# Patient Record
Sex: Female | Born: 1960 | Hispanic: No | Marital: Married | Smoking: Current every day smoker
Health system: Southern US, Community
[De-identification: ages and names within clinical notes are randomized; demographics above are authoritative.]

## PROBLEM LIST (undated history)

## (undated) DIAGNOSIS — N946 Dysmenorrhea, unspecified: Secondary | ICD-10-CM

## (undated) DIAGNOSIS — M8580 Other specified disorders of bone density and structure, unspecified site: Secondary | ICD-10-CM

## (undated) DIAGNOSIS — B9689 Other specified bacterial agents as the cause of diseases classified elsewhere: Secondary | ICD-10-CM

## (undated) DIAGNOSIS — N920 Excessive and frequent menstruation with regular cycle: Secondary | ICD-10-CM

## (undated) DIAGNOSIS — Z8041 Family history of malignant neoplasm of ovary: Secondary | ICD-10-CM

## (undated) DIAGNOSIS — N83209 Unspecified ovarian cyst, unspecified side: Secondary | ICD-10-CM

## (undated) DIAGNOSIS — Z803 Family history of malignant neoplasm of breast: Secondary | ICD-10-CM

## (undated) DIAGNOSIS — R87619 Unspecified abnormal cytological findings in specimens from cervix uteri: Secondary | ICD-10-CM

## (undated) DIAGNOSIS — D649 Anemia, unspecified: Secondary | ICD-10-CM

## (undated) DIAGNOSIS — N760 Acute vaginitis: Secondary | ICD-10-CM

## (undated) DIAGNOSIS — N809 Endometriosis, unspecified: Secondary | ICD-10-CM

## (undated) DIAGNOSIS — K219 Gastro-esophageal reflux disease without esophagitis: Secondary | ICD-10-CM

## (undated) DIAGNOSIS — J449 Chronic obstructive pulmonary disease, unspecified: Secondary | ICD-10-CM

## (undated) DIAGNOSIS — E7800 Pure hypercholesterolemia, unspecified: Secondary | ICD-10-CM

## (undated) DIAGNOSIS — Z1371 Encounter for nonprocreative screening for genetic disease carrier status: Secondary | ICD-10-CM

## (undated) HISTORY — DX: Anemia, unspecified: D64.9

## (undated) HISTORY — DX: Family history of malignant neoplasm of ovary: Z80.41

## (undated) HISTORY — DX: Other specified disorders of bone density and structure, unspecified site: M85.80

## (undated) HISTORY — DX: Family history of malignant neoplasm of breast: Z80.3

## (undated) HISTORY — DX: Pure hypercholesterolemia, unspecified: E78.00

## (undated) HISTORY — PX: BACK SURGERY: SHX140

## (undated) HISTORY — PX: DIAGNOSTIC LAPAROSCOPY: SUR761

## (undated) HISTORY — DX: Unspecified ovarian cyst, unspecified side: N83.209

## (undated) HISTORY — DX: Dysmenorrhea, unspecified: N94.6

## (undated) HISTORY — DX: Other specified bacterial agents as the cause of diseases classified elsewhere: N76.0

## (undated) HISTORY — DX: Unspecified abnormal cytological findings in specimens from cervix uteri: R87.619

## (undated) HISTORY — DX: Excessive and frequent menstruation with regular cycle: N92.0

## (undated) HISTORY — DX: Other specified bacterial agents as the cause of diseases classified elsewhere: B96.89

## (undated) HISTORY — DX: Endometriosis, unspecified: N80.9

## (undated) HISTORY — PX: LAPAROSCOPIC ENDOMETRIOSIS FULGURATION: SUR769

## (undated) HISTORY — DX: Encounter for nonprocreative screening for genetic disease carrier status: Z13.71

---

## 2002-01-26 ENCOUNTER — Ambulatory Visit (HOSPITAL_COMMUNITY): Admission: RE | Admit: 2002-01-26 | Discharge: 2002-01-27 | Payer: Self-pay | Admitting: Neurosurgery

## 2002-01-26 ENCOUNTER — Encounter: Payer: Self-pay | Admitting: Neurosurgery

## 2005-01-07 ENCOUNTER — Ambulatory Visit: Payer: Self-pay

## 2007-03-19 ENCOUNTER — Observation Stay: Payer: Self-pay | Admitting: Internal Medicine

## 2007-03-19 ENCOUNTER — Other Ambulatory Visit: Payer: Self-pay

## 2007-03-19 ENCOUNTER — Ambulatory Visit: Payer: Self-pay | Admitting: Internal Medicine

## 2007-06-29 ENCOUNTER — Ambulatory Visit: Payer: Self-pay

## 2008-11-27 ENCOUNTER — Ambulatory Visit: Payer: Self-pay | Admitting: Internal Medicine

## 2009-06-15 ENCOUNTER — Ambulatory Visit: Payer: Self-pay

## 2010-06-12 ENCOUNTER — Ambulatory Visit: Payer: Self-pay

## 2011-06-13 ENCOUNTER — Ambulatory Visit: Payer: Self-pay

## 2012-01-20 ENCOUNTER — Ambulatory Visit: Payer: Self-pay

## 2012-01-20 LAB — RAPID STREP-A WITH REFLX: Micro Text Report: POSITIVE

## 2012-07-06 ENCOUNTER — Ambulatory Visit: Payer: Self-pay

## 2012-07-14 ENCOUNTER — Ambulatory Visit: Payer: Self-pay

## 2013-01-12 ENCOUNTER — Ambulatory Visit: Payer: Self-pay

## 2013-07-30 DIAGNOSIS — Z1371 Encounter for nonprocreative screening for genetic disease carrier status: Secondary | ICD-10-CM

## 2013-07-30 DIAGNOSIS — Z803 Family history of malignant neoplasm of breast: Secondary | ICD-10-CM

## 2013-07-30 HISTORY — DX: Family history of malignant neoplasm of breast: Z80.3

## 2013-07-30 HISTORY — DX: Encounter for nonprocreative screening for genetic disease carrier status: Z13.71

## 2013-08-04 ENCOUNTER — Ambulatory Visit: Payer: Self-pay

## 2014-09-01 ENCOUNTER — Other Ambulatory Visit: Payer: Self-pay | Admitting: Obstetrics and Gynecology

## 2014-09-01 DIAGNOSIS — Z1231 Encounter for screening mammogram for malignant neoplasm of breast: Secondary | ICD-10-CM

## 2014-09-06 ENCOUNTER — Ambulatory Visit
Admission: RE | Admit: 2014-09-06 | Discharge: 2014-09-06 | Disposition: A | Discharge status: Home / Self Care | Payer: 59 | Source: Ambulatory Visit | Attending: Obstetrics and Gynecology | Admitting: Obstetrics and Gynecology

## 2014-09-06 DIAGNOSIS — Z1231 Encounter for screening mammogram for malignant neoplasm of breast: Secondary | ICD-10-CM | POA: Diagnosis not present

## 2014-10-17 NOTE — Discharge Instructions (Signed)

## 2014-10-19 ENCOUNTER — Ambulatory Visit
Admission: RE | Admit: 2014-10-19 | Discharge: 2014-10-19 | Disposition: A | Discharge status: Home / Self Care | Payer: 59 | Source: Ambulatory Visit | Attending: Gastroenterology | Admitting: Gastroenterology

## 2014-10-19 ENCOUNTER — Encounter
Admission: RE | Disposition: A | Discharge status: Home / Self Care | Payer: Self-pay | Source: Ambulatory Visit | Attending: Gastroenterology

## 2014-10-19 ENCOUNTER — Ambulatory Visit: Payer: 59 | Admitting: Anesthesiology

## 2014-10-19 ENCOUNTER — Encounter: Payer: Self-pay | Admitting: *Deleted

## 2014-10-19 DIAGNOSIS — Z807 Family history of other malignant neoplasms of lymphoid, hematopoietic and related tissues: Secondary | ICD-10-CM | POA: Diagnosis not present

## 2014-10-19 DIAGNOSIS — Z803 Family history of malignant neoplasm of breast: Secondary | ICD-10-CM | POA: Diagnosis not present

## 2014-10-19 DIAGNOSIS — Z801 Family history of malignant neoplasm of trachea, bronchus and lung: Secondary | ICD-10-CM | POA: Diagnosis not present

## 2014-10-19 DIAGNOSIS — K219 Gastro-esophageal reflux disease without esophagitis: Secondary | ICD-10-CM | POA: Diagnosis not present

## 2014-10-19 DIAGNOSIS — Z79899 Other long term (current) drug therapy: Secondary | ICD-10-CM | POA: Insufficient documentation

## 2014-10-19 DIAGNOSIS — Z1211 Encounter for screening for malignant neoplasm of colon: Secondary | ICD-10-CM | POA: Insufficient documentation

## 2014-10-19 DIAGNOSIS — J449 Chronic obstructive pulmonary disease, unspecified: Secondary | ICD-10-CM | POA: Insufficient documentation

## 2014-10-19 DIAGNOSIS — F17210 Nicotine dependence, cigarettes, uncomplicated: Secondary | ICD-10-CM | POA: Insufficient documentation

## 2014-10-19 HISTORY — DX: Gastro-esophageal reflux disease without esophagitis: K21.9

## 2014-10-19 HISTORY — PX: COLONOSCOPY: SHX5424

## 2014-10-19 HISTORY — DX: Chronic obstructive pulmonary disease, unspecified: J44.9

## 2014-10-19 SURGERY — COLONOSCOPY
Anesthesia: Monitor Anesthesia Care | Wound class: Contaminated

## 2014-10-19 MED ORDER — OXYCODONE HCL 5 MG PO TABS: 5.0000 mg | ORAL_TABLET | Freq: Once | ORAL | Status: DC | PRN

## 2014-10-19 MED ORDER — ACETAMINOPHEN 160 MG/5ML PO SOLN: 325.0000 mg | ORAL | Status: DC | PRN

## 2014-10-19 MED ORDER — ACETAMINOPHEN 325 MG PO TABS: 325.0000 mg | ORAL_TABLET | ORAL | Status: DC | PRN

## 2014-10-19 MED ORDER — SIMETHICONE 40 MG/0.6ML PO SUSP
ORAL | Status: DC | PRN
  Administered 2014-10-19: 10:00:00

## 2014-10-19 MED ORDER — DEXAMETHASONE SODIUM PHOSPHATE 4 MG/ML IJ SOLN: 8.0000 mg | Freq: Once | INTRAMUSCULAR | Status: DC | PRN

## 2014-10-19 MED ORDER — LIDOCAINE HCL (CARDIAC) 20 MG/ML IV SOLN
INTRAVENOUS | Status: DC | PRN
  Administered 2014-10-19: 50 mg via INTRAVENOUS

## 2014-10-19 MED ORDER — LACTATED RINGERS IV SOLN
INTRAVENOUS | Status: DC
  Administered 2014-10-19: 09:00:00 via INTRAVENOUS

## 2014-10-19 MED ORDER — LACTATED RINGERS IV SOLN
500.0000 mL | INTRAVENOUS | Status: DC
  Administered 2014-10-19: 10:00:00 via INTRAVENOUS

## 2014-10-19 MED ORDER — PROPOFOL 10 MG/ML IV BOLUS
INTRAVENOUS | Status: DC | PRN
  Administered 2014-10-19: 30 mg via INTRAVENOUS
  Administered 2014-10-19: 10 mg via INTRAVENOUS
  Administered 2014-10-19: 30 mg via INTRAVENOUS
  Administered 2014-10-19: 20 mg via INTRAVENOUS
  Administered 2014-10-19: 60 mg via INTRAVENOUS
  Administered 2014-10-19 (×2): 30 mg via INTRAVENOUS
  Administered 2014-10-19: 20 mg via INTRAVENOUS

## 2014-10-19 MED ORDER — FENTANYL CITRATE (PF) 100 MCG/2ML IJ SOLN: 25.0000 ug | INTRAMUSCULAR | Status: DC | PRN

## 2014-10-19 MED ORDER — SODIUM CHLORIDE 0.9 % IV SOLN: INTRAVENOUS | Status: DC

## 2014-10-19 MED ORDER — OXYCODONE HCL 5 MG/5ML PO SOLN: 5.0000 mg | Freq: Once | ORAL | Status: DC | PRN

## 2014-10-19 SURGICAL SUPPLY — 30 items

## 2014-10-19 NOTE — Anesthesia Postprocedure Evaluation (Signed)
  Anesthesia Post-op Note  Patient: Eileen Cunningham  Procedure(s) Performed: Procedure(s): COLONOSCOPY (N/A)  Anesthesia type:MAC  Patient location: PACU  Post pain: Pain level controlled  Post assessment: Post-op Vital signs reviewed, Patient's Cardiovascular Status Stable, Respiratory Function Stable, Patent Airway and No signs of Nausea or vomiting  Post vital signs: Reviewed and stable  Last Vitals:  Filed Vitals:   10/19/14 1015  BP: 108/71  Pulse: 66  Temp:   Resp: 27    Level of consciousness: awake, alert  and patient cooperative  Complications: No apparent anesthesia complications

## 2014-10-19 NOTE — Anesthesia Procedure Notes (Signed)
Procedure Name: MAC Performed by: Enos Muhl Pre-anesthesia Checklist: Patient identified, Emergency Drugs available, Suction available, Timeout performed and Patient being monitored Patient Re-evaluated:Patient Re-evaluated prior to inductionOxygen Delivery Method: Nasal cannula Placement Confirmation: positive ETCO2       

## 2014-10-19 NOTE — Transfer of Care (Signed)
Immediate Anesthesia Transfer of Care Note  Patient: Eileen Cunningham  Procedure(s) Performed: Procedure(s): COLONOSCOPY (N/A)  Patient Location: PACU  Anesthesia Type: MAC  Level of Consciousness: awake, alert  and patient cooperative  Airway and Oxygen Therapy: Patient Spontanous Breathing and Patient connected to supplemental oxygen  Post-op Assessment: Post-op Vital signs reviewed, Patient's Cardiovascular Status Stable, Respiratory Function Stable, Patent Airway and No signs of Nausea or vomiting  Post-op Vital Signs: Reviewed and stable  Complications: No apparent anesthesia complications

## 2014-10-19 NOTE — H&P (Signed)
    Primary Care Physician:  No primary care provider on file. Primary Gastroenterologist:  Dr. Bluford Kaufmannh  Pre-Procedure History & Physical: HPI:  Eileen Cunningham is a 54 y.o. female is here for an colonoscopy.   Past Medical History  Diagnosis Date  . COPD (chronic obstructive pulmonary disease)     mild, no meds  . GERD (gastroesophageal reflux disease)     occasional heartburn    Past Surgical History  Procedure Laterality Date  . Back surgery      decompression,lower lumbar  . Diagnostic laparoscopy    . Laparoscopic endometriosis fulguration      Prior to Admission medications   Medication Sig Start Date End Date Taking? Authorizing Provider  b complex vitamins capsule Take 1 capsule by mouth daily.   Yes Historical Provider, MD  cholecalciferol (VITAMIN D) 400 UNITS TABS tablet Take 800 Units by mouth daily.   Yes Historical Provider, MD  Coenzyme Q10 (COQ10) 100 MG CAPS Take by mouth daily.   Yes Historical Provider, MD  Esomeprazole Magnesium (NEXIUM 24HR PO) Take by mouth.   Yes Historical Provider, MD  Fenofibrate 120 MG TABS Take by mouth daily. am   Yes Historical Provider, MD  Resveratrol 250 MG CAPS Take 2 capsules by mouth daily. am   Yes Historical Provider, MD    Allergies as of 09/27/2014  . (Not on File)    Family History  Problem Relation Age of Onset  . Breast cancer Sister 2750    recurrance at age 54  . Lymphoma Brother 55    non hodgkins  . Lung cancer Brother 5160    History   Social History  . Marital Status: Married    Spouse Name: N/A  . Number of Children: N/A  . Years of Education: N/A   Occupational History  . Not on file.   Social History Main Topics  . Smoking status: Current Every Day Smoker -- 1.00 packs/day for 40 years    Types: Cigarettes  . Smokeless tobacco: Never Used  . Alcohol Use: Yes     Comment: rarely wine  . Drug Use: No  . Sexual Activity: Not on file   Other Topics Concern  . Not on file   Social History  Narrative    Review of Systems: See HPI, otherwise negative ROS  Physical Exam: BP 102/65 mmHg  Pulse 64  Temp(Src) 97.9 F (36.6 C) (Other (Comment))  Resp 18  Ht 5\' 6"  (1.676 m)  Wt 87.544 kg (193 lb)  BMI 31.17 kg/m2 General:   Alert,  pleasant and cooperative in NAD Head:  Normocephalic and atraumatic. Neck:  Supple; no masses or thyromegaly. Lungs:  Clear throughout to auscultation.    Heart:  Regular rate and rhythm. Abdomen:  Soft, nontender and nondistended. Normal bowel sounds, without guarding, and without rebound.   Neurologic:  Alert and  oriented x4;  grossly normal neurologically.  Impression/Plan: Eileen Cunningham is here for an colonoscopy to be performed for screening. Risks, benefits, limitations, and alternatives regarding  colonoscopy have been reviewed with the patient.  Questions have been answered.  All parties agreeable.   Lamount Bankson, Ezzard StandingPAUL Y, MD  10/19/2014, 9:03 AM

## 2014-10-19 NOTE — Anesthesia Preprocedure Evaluation (Signed)
Anesthesia Evaluation  Patient identified by MRN, date of birth, ID band Patient awake    Reviewed: Allergy & Precautions, H&P , NPO status , Patient's Chart, lab work & pertinent test results, reviewed documented beta blocker date and time   Airway Mallampati: II  TM Distance: >3 FB Neck ROM: full    Dental no notable dental hx.    Pulmonary COPDCurrent Smoker,  breath sounds clear to auscultation  Pulmonary exam normal       Cardiovascular Exercise Tolerance: Good negative cardio ROS  Rhythm:regular Rate:Normal     Neuro/Psych negative neurological ROS  negative psych ROS   GI/Hepatic Neg liver ROS, GERD-  Medicated,  Endo/Other  negative endocrine ROS  Renal/GU negative Renal ROS  negative genitourinary   Musculoskeletal   Abdominal   Peds  Hematology negative hematology ROS (+)   Anesthesia Other Findings   Reproductive/Obstetrics negative OB ROS                             Anesthesia Physical Anesthesia Plan  ASA: II  Anesthesia Plan: MAC   Post-op Pain Management:    Induction:   Airway Management Planned:   Additional Equipment:   Intra-op Plan:   Post-operative Plan:   Informed Consent: I have reviewed the patients History and Physical, chart, labs and discussed the procedure including the risks, benefits and alternatives for the proposed anesthesia with the patient or authorized representative who has indicated his/her understanding and acceptance.     Plan Discussed with: CRNA  Anesthesia Plan Comments:         Anesthesia Quick Evaluation

## 2014-10-19 NOTE — Op Note (Signed)
[facility] Patient Name: Eileen Cunningham Procedure Date: 10/19/2014 9:39 AM MRN: [id number] Account #: [id number] Date of Birth: 1960-12-07 Admit Type: Outpatient Age: 54 Room: [facility] OR ROOM 01 Gender: Female Note Status: Finalized Procedure:         Colonoscopy Indications:       Screening for colorectal malignant neoplasm Providers:         Ezzard Standing. Bluford Kaufmann, MD Medicines:         Monitored Anesthesia Care Complications:     No immediate complications. Procedure:         Pre-Anesthesia Assessment:                    - Prior to the procedure, a History and Physical was                     performed, and patient medications, allergies and                     sensitivities were reviewed. The patient's tolerance of                     previous anesthesia was reviewed.                    - The risks and benefits of the procedure and the sedation                     options and risks were discussed with the patient. All                     questions were answered and informed consent was obtained.                    - After reviewing the risks and benefits, the patient was                     deemed in satisfactory condition to undergo the procedure.                    After obtaining informed consent, the colonoscope was                     passed under direct vision. Throughout the procedure, the                     patient's blood pressure, pulse, and oxygen saturations                     were monitored continuously. The Olympus CF H180AL                     colonoscope (S#: [id number]) was introduced through the anus                     and advanced to the the cecum, identified by appendiceal                     orifice and ileocecal valve. The colonoscopy was performed                     without difficulty. The patient tolerated the procedure                     well. The quality of the bowel  preparation was good. Findings:      The colon (entire  examined portion) appeared normal. Impression:        - The entire examined colon is normal.                    - No specimens collected. Recommendation:    - Discharge patient to home.                    - Repeat colonoscopy in 10 years for surveillance.                    - The findings and recommendations were discussed with the                     patient. Procedure Code(s): --- Professional ---                    [phone], Colonoscopy, flexible; diagnostic, including                     collection of specimen(s) by brushing or washing, when                     performed (separate procedure) Diagnosis Code(s): --- Professional ---                    Z12.11, Encounter for screening for malignant neoplasm of                     colon CPT copyright 2014 American Medical Association. All rights reserved. The codes documented in this report are preliminary and upon coder review may  be revised to meet current compliance requirements. Wallace CullensPaul Y Farrel Guimond, MD 10/19/2014 10:07:18 AM This report has been signed electronically. Number of Addenda: 0 Note Initiated On: 10/19/2014 9:39 AM Scope Withdrawal Time: 0 hours 7 minutes 51 seconds  Total Procedure Duration: 0 hours 14 minutes 55 seconds       [facility]

## 2014-10-20 ENCOUNTER — Encounter: Payer: Self-pay | Admitting: Gastroenterology

## 2016-02-12 ENCOUNTER — Other Ambulatory Visit: Payer: Self-pay | Admitting: Obstetrics and Gynecology

## 2016-02-12 DIAGNOSIS — Z720 Tobacco use: Secondary | ICD-10-CM

## 2016-03-04 ENCOUNTER — Other Ambulatory Visit: Payer: Self-pay | Admitting: Obstetrics and Gynecology

## 2016-03-04 DIAGNOSIS — Z1231 Encounter for screening mammogram for malignant neoplasm of breast: Secondary | ICD-10-CM

## 2016-03-19 ENCOUNTER — Ambulatory Visit
Admission: RE | Admit: 2016-03-19 | Discharge: 2016-03-19 | Disposition: A | Discharge status: Home / Self Care | Payer: Managed Care, Other (non HMO) | Source: Ambulatory Visit | Attending: Obstetrics and Gynecology | Admitting: Obstetrics and Gynecology

## 2016-03-19 DIAGNOSIS — Z87891 Personal history of nicotine dependence: Secondary | ICD-10-CM | POA: Insufficient documentation

## 2016-03-19 DIAGNOSIS — Z1231 Encounter for screening mammogram for malignant neoplasm of breast: Secondary | ICD-10-CM | POA: Diagnosis not present

## 2016-03-19 DIAGNOSIS — Z720 Tobacco use: Secondary | ICD-10-CM

## 2016-08-19 ENCOUNTER — Other Ambulatory Visit: Payer: Self-pay | Admitting: Obstetrics and Gynecology

## 2016-08-19 DIAGNOSIS — E785 Hyperlipidemia, unspecified: Secondary | ICD-10-CM | POA: Insufficient documentation

## 2016-08-19 DIAGNOSIS — E782 Mixed hyperlipidemia: Secondary | ICD-10-CM

## 2016-08-19 NOTE — Progress Notes (Signed)
Pt due for repeat labs. On fenofibrate 120 mg daily. Can't tolerate statins.

## 2016-08-22 ENCOUNTER — Telehealth: Payer: Self-pay | Admitting: Obstetrics and Gynecology

## 2016-08-22 NOTE — Telephone Encounter (Signed)
This is a patient of [facility] as evidenced by [place].

## 2016-08-22 NOTE — Telephone Encounter (Signed)
Pt stated that she had her last OV in November or December of 2017 and she was supposed to come back in just to have her labs rechecked. Pt is requesting a lab order so she can get her labs done at the [facility] in [place]. Pt request a call back when lab slip is ready for pick up or if can be sent directly to [facility] at [facility] [place]. Please advise. Thanks TNP

## 2016-08-24 ENCOUNTER — Other Ambulatory Visit: Payer: Self-pay | Admitting: Obstetrics and Gynecology

## 2016-08-24 DIAGNOSIS — E782 Mixed hyperlipidemia: Secondary | ICD-10-CM

## 2016-08-24 NOTE — Telephone Encounter (Signed)
Order in computer. RN to notify pt to get fasting labs done at [facility] in [place] at her convenience.

## 2016-08-27 NOTE — Telephone Encounter (Signed)
Left msg for pt with this information  

## 2016-08-31 LAB — NMR, LIPOPROFILE
CHOLESTEROL: 206 mg/dL — AB (ref 100–199)
HDL Cholesterol by NMR: 71 mg/dL (ref >39)
HDL Particle Number: 48.7 umol/L (ref >=30.5)
LDL Particle Number: 1325 nmol/L — ABNORMAL HIGH (ref <1000)
LDL SIZE: 21.7 nm (ref >20.5)
LDL-C: 123 mg/dL — ABNORMAL HIGH (ref 0–99)
LP-IR Score: 39 (ref <=45)
Small LDL Particle Number: 386 nmol/L (ref <=527)
TRIGLYCERIDES BY NMR: 60 mg/dL (ref 0–149)

## 2016-09-02 ENCOUNTER — Other Ambulatory Visit: Payer: Self-pay | Admitting: Obstetrics and Gynecology

## 2016-09-02 MED ORDER — FENOFIBRATE 120 MG PO TABS: 1.0000 | ORAL_TABLET | Freq: Every day | ORAL | 5 refills | Status: DC

## 2017-02-15 ENCOUNTER — Other Ambulatory Visit: Payer: Self-pay | Admitting: Obstetrics and Gynecology

## 2017-02-17 NOTE — Telephone Encounter (Signed)
Please advise for refills. Thank you! 

## 2017-04-08 ENCOUNTER — Encounter: Payer: Self-pay | Admitting: Obstetrics and Gynecology

## 2017-04-08 ENCOUNTER — Ambulatory Visit (INDEPENDENT_AMBULATORY_CARE_PROVIDER_SITE_OTHER): Payer: Managed Care, Other (non HMO) | Admitting: Obstetrics and Gynecology

## 2017-04-08 VITALS — BP 128/88 | Ht 66.0 in | Wt 212.0 lb

## 2017-04-08 DIAGNOSIS — E782 Mixed hyperlipidemia: Secondary | ICD-10-CM | POA: Diagnosis not present

## 2017-04-08 DIAGNOSIS — Z1231 Encounter for screening mammogram for malignant neoplasm of breast: Secondary | ICD-10-CM | POA: Diagnosis not present

## 2017-04-08 DIAGNOSIS — Z0000 Encounter for general adult medical examination without abnormal findings: Secondary | ICD-10-CM | POA: Diagnosis not present

## 2017-04-08 DIAGNOSIS — Z1239 Encounter for other screening for malignant neoplasm of breast: Secondary | ICD-10-CM

## 2017-04-08 DIAGNOSIS — Z1151 Encounter for screening for human papillomavirus (HPV): Secondary | ICD-10-CM | POA: Diagnosis not present

## 2017-04-08 DIAGNOSIS — Z01419 Encounter for gynecological examination (general) (routine) without abnormal findings: Secondary | ICD-10-CM

## 2017-04-08 DIAGNOSIS — Z720 Tobacco use: Secondary | ICD-10-CM | POA: Diagnosis not present

## 2017-04-08 DIAGNOSIS — Z124 Encounter for screening for malignant neoplasm of cervix: Secondary | ICD-10-CM

## 2017-04-08 MED ORDER — FENOFIBRATE 120 MG PO TABS: 1.0000 | ORAL_TABLET | Freq: Every day | ORAL | 0 refills | Status: DC

## 2017-04-08 NOTE — Patient Instructions (Addendum)
I value your feedback and entrusting us with your care. If you get a [facility] patient survey, I would appreciate you taking the time to let us know about your experience today. Thank you!  [facility]: [phone]    

## 2017-04-08 NOTE — Progress Notes (Signed)
PCP: System, Provider Not In   Chief Complaint  Patient presents with  . Annual Exam    HPI:      [name] is a 57 y.o. No obstetric history on file. who LMP was No LMP recorded. Patient is postmenopausal., presents today for her annual examination.  Her menses are absent due to menopause. She does not have intermenstrual bleeding.  She does have occas vasomotor sx.  Sex activity: not sexually active. She does not have vaginal dryness.  Last Pap: July 14, 2013  Results were: no abnormalities /neg HPV DNA.  Hx of STDs: none  Last mammogram: March 19, 2016  Results were: normal--routine follow-up in 12 months There is a FH of breast cancer in her sister. Pt is MyRisk neg 2015, IBIS=15%. There is a FH of ovarian cancer in her mat aunt. The patient does do self-breast exams.  Colonoscopy: colonoscopy 3 years ago without abnormalities. . Repeat due after 10 years.   Tobacco use: 1/2-3/4 ppd for many yrs, not interested in quitting. She gets yearly CXRs due to smoking hx. Alcohol use: social drinker Exercise: very active  She does get adequate calcium and Vitamin D in her diet.  She has hyperlipidemia and takes fenofibrate 120 mg daily. She can't tolerate statins. She ran out of Rx about 3 wks ago. She is due for repeat labs.   Past Medical History:  Diagnosis Date  . Abnormal Pap smear of cervix .  Marland Kitchen Anemia   . Bacterial vaginosis   . BRCA negative 07/2013   MyRisk neg  . COPD (chronic obstructive pulmonary disease) (HCC)    mild, no meds  . Dysmenorrhea   . Endometriosis   . Family history of breast cancer 07/2013   MyRisk neg; IBIS=15%  . Family history of ovarian cancer    MyRisk neg 5/15  . GERD (gastroesophageal reflux disease)    occasional heartburn  . Hypercholesterolemia   . Menorrhagia   . Osteopenia   . Ovarian cyst     Past Surgical History:  Procedure Laterality Date  . BACK SURGERY     decompression,lower lumbar  . COLONOSCOPY N/A  10/19/2014   Procedure: COLONOSCOPY;  Surgeon: Hulen Luster, MD;  Location: [place];  Service: Gastroenterology;  Laterality: N/A;  . DIAGNOSTIC LAPAROSCOPY    . LAPAROSCOPIC ENDOMETRIOSIS FULGURATION      Family History  Problem Relation Age of Onset  . Lymphoma Brother 55       non hodgkins  . Breast cancer Sister 53       recurrance at age 22  . Endometriosis Sister   . Lung cancer Brother 62  . Ovarian cancer Maternal Aunt     Social History   Socioeconomic History  . Marital status: Married    Spouse name: Not on file  . Number of children: Not on file  . Years of education: Not on file  . Highest education level: Not on file  Social Needs  . Financial resource strain: Not on file  . Food insecurity - worry: Not on file  . Food insecurity - inability: Not on file  . Transportation needs - medical: Not on file  . Transportation needs - non-medical: Not on file  Occupational History  . Not on file  Tobacco Use  . Smoking status: Current Every Day Smoker    Packs/day: 1.00    Years: 40.00    Pack years: 40.00    Types: Cigarettes  .  Smokeless tobacco: Never Used  Substance and Sexual Activity  . Alcohol use: Yes    Comment: rarely wine  . Drug use: No  . Sexual activity: Not Currently  Other Topics Concern  . Not on file  Social History Narrative  . Not on file    Current Meds  Medication Sig  . b complex vitamins capsule Take 1 capsule by mouth daily.  . cholecalciferol (VITAMIN D) 400 UNITS TABS tablet Take 800 Units by mouth daily.  . Coenzyme Q10 (COQ10) 100 MG CAPS Take by mouth daily.  . Esomeprazole Magnesium (NEXIUM 24HR PO) Take by mouth.  . Resveratrol 250 MG CAPS Take 2 capsules by mouth daily. am      ROS:  Review of Systems  Constitutional: Negative for fatigue, fever and unexpected weight change.  Respiratory: Negative for cough, shortness of breath and wheezing.   Cardiovascular: Negative for chest pain, palpitations and leg  swelling.  Gastrointestinal: Negative for blood in stool, constipation, diarrhea, nausea and vomiting.  Endocrine: Negative for cold intolerance, heat intolerance and polyuria.  Genitourinary: Negative for dyspareunia, dysuria, flank pain, frequency, genital sores, hematuria, menstrual problem, pelvic pain, urgency, vaginal bleeding, vaginal discharge and vaginal pain.  Musculoskeletal: Negative for back pain, joint swelling and myalgias.  Skin: Negative for rash.  Neurological: Negative for dizziness, syncope, light-headedness, numbness and headaches.  Hematological: Negative for adenopathy.  Psychiatric/Behavioral: Negative for agitation, confusion, sleep disturbance and suicidal ideas. The patient is not nervous/anxious.      Objective: BP 128/88   Ht _0  (1.676 m)   Wt 212 lb (96.2 kg)   BMI 34.22 kg/m    Physical Exam  Constitutional: She is oriented to person, place, and time. She appears well-developed and well-nourished.  Genitourinary: Vagina normal and uterus normal. There is no rash or tenderness on the right labia. There is no rash or tenderness on the left labia. No erythema or tenderness in the vagina. No vaginal discharge found. Right adnexum does not display mass and does not display tenderness. Left adnexum does not display mass and does not display tenderness. Cervix does not exhibit motion tenderness or polyp. Uterus is not enlarged or tender.  Neck: Normal range of motion. No thyromegaly present.  Cardiovascular: Normal rate, regular rhythm and normal heart sounds.  No murmur heard. Pulmonary/Chest: Effort normal and breath sounds normal. Right breast exhibits no mass, no nipple discharge, no skin change and no tenderness. Left breast exhibits no mass, no nipple discharge, no skin change and no tenderness.  Abdominal: Soft. There is no tenderness. There is no guarding.  Musculoskeletal: Normal range of motion.  Neurological: She is alert and oriented to person,  place, and time. No cranial nerve deficit.  Psychiatric: She has a normal mood and affect. Her behavior is normal.  Vitals reviewed.   Assessment/Plan:  Encounter for annual routine gynecological examination  Cervical cancer screening - Plan: IGP, Aptima HPV  Screening for HPV (human papillomavirus) - Plan: IGP, Aptima HPV  Screening for breast cancer - Will try to sched mammo and CXR same day. - Plan: MM SCREENING BREAST TOMO BILATERAL  Blood tests for routine general physical examination - Plan: Comprehensive metabolic panel, Lipid panel, CANCELED: Comprehensive metabolic panel, CANCELED: Lipid panel, CANCELED: Lipid panel  Mixed hyperlipidemia - Pt due for labs but off meds for 3 wks. Restart fenofibrate. Rx eRxd. Check labs in 4 wks. Will f/u with results and refill meds based on labs.  - Plan: Lipid panel, Fenofibrate 120 MG  TABS, CANCELED: Lipid panel, CANCELED: Lipid panel  Tobacco use - Check CXR. Will f/u with results. Pt not interested in quitting. - Plan: DG Chest 2 View, CANCELED: DG Chest 2 View   Meds ordered this encounter  Medications  . Fenofibrate 120 MG TABS    Sig: Take 1 tablet (120 mg total) by mouth daily.    Dispense:  30 each    Refill:  0           GYN counsel breast self exam, mammography screening, adequate intake of calcium and vitamin D, diet and exercise, tobacco cessation    F/U  Return in about 1 year (around 04/08/2018).  Alicia B. Copland, PA-C 04/08/2017 2:48 PM

## 2017-04-09 NOTE — Progress Notes (Signed)
Patient is aware of mammo in [place] on Wed, 04/16/17 @ 11:40am and that she can walk-in for the xray anytime.

## 2017-04-10 LAB — IGP, APTIMA HPV
HPV Aptima: NEGATIVE
PAP Smear Comment: 0

## 2017-04-16 ENCOUNTER — Ambulatory Visit
Admission: RE | Admit: 2017-04-16 | Discharge: 2017-04-16 | Disposition: A | Discharge status: Home / Self Care | Payer: Managed Care, Other (non HMO) | Source: Ambulatory Visit | Attending: Obstetrics and Gynecology | Admitting: Obstetrics and Gynecology

## 2017-04-16 ENCOUNTER — Encounter: Payer: Self-pay | Admitting: Obstetrics and Gynecology

## 2017-04-16 DIAGNOSIS — Z720 Tobacco use: Secondary | ICD-10-CM | POA: Diagnosis present

## 2017-04-16 DIAGNOSIS — Z1231 Encounter for screening mammogram for malignant neoplasm of breast: Secondary | ICD-10-CM | POA: Diagnosis present

## 2017-04-16 DIAGNOSIS — Z1239 Encounter for other screening for malignant neoplasm of breast: Secondary | ICD-10-CM

## 2017-04-16 IMAGING — MG MM DIGITAL SCREENING BILAT W/ CAD
4 series · 4 of 4 positions shown · non-contrast
Comparison: Previous exam(s).

CLINICAL DATA: Screening.

EXAM:
DIGITAL SCREENING BILATERAL MAMMOGRAM WITH CAD

[L MLO]
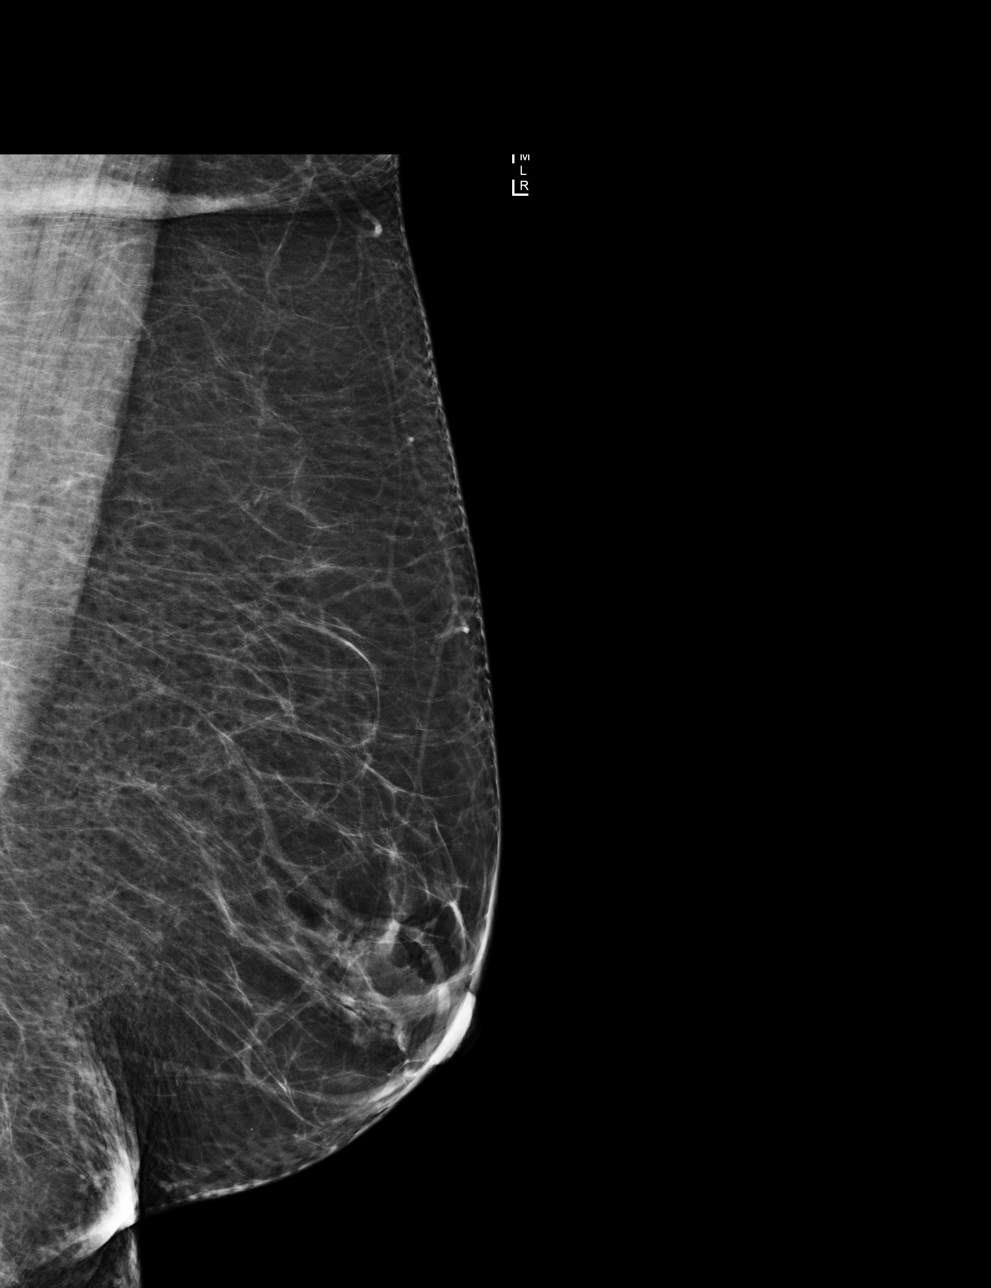

[R CC]
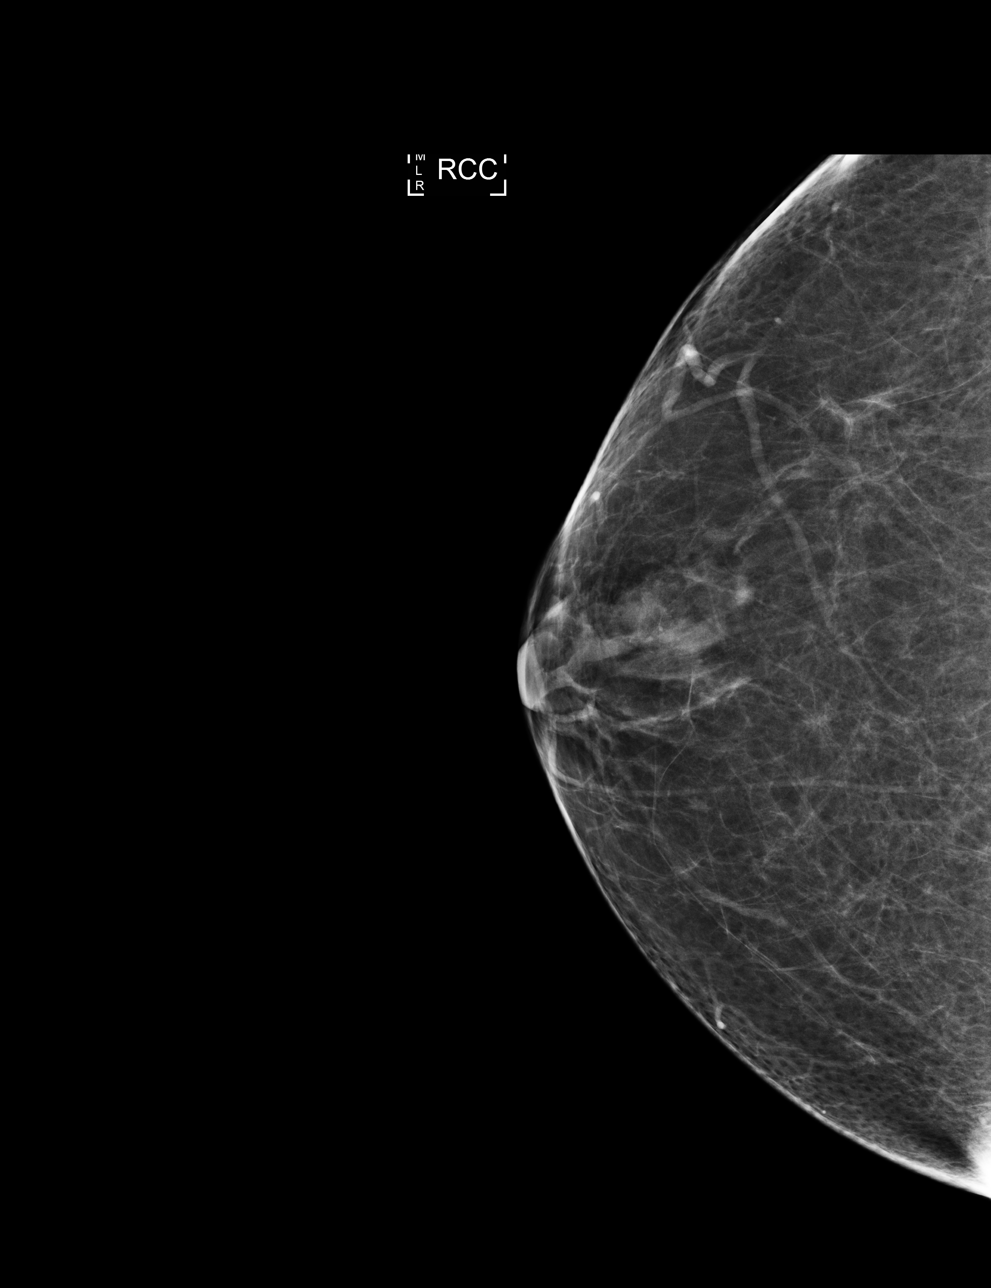

[L CC]
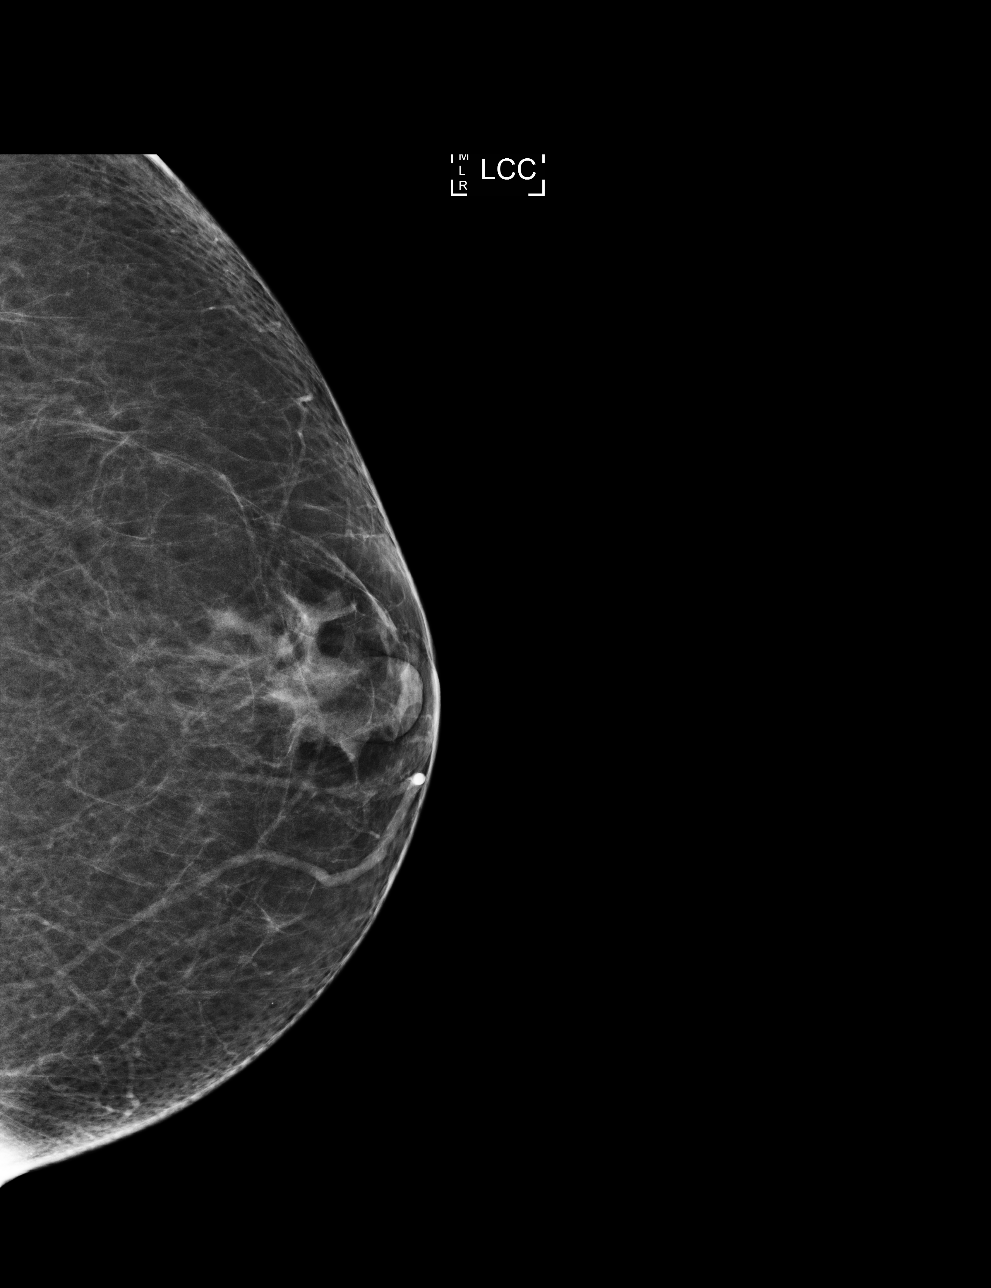

[R MLO]
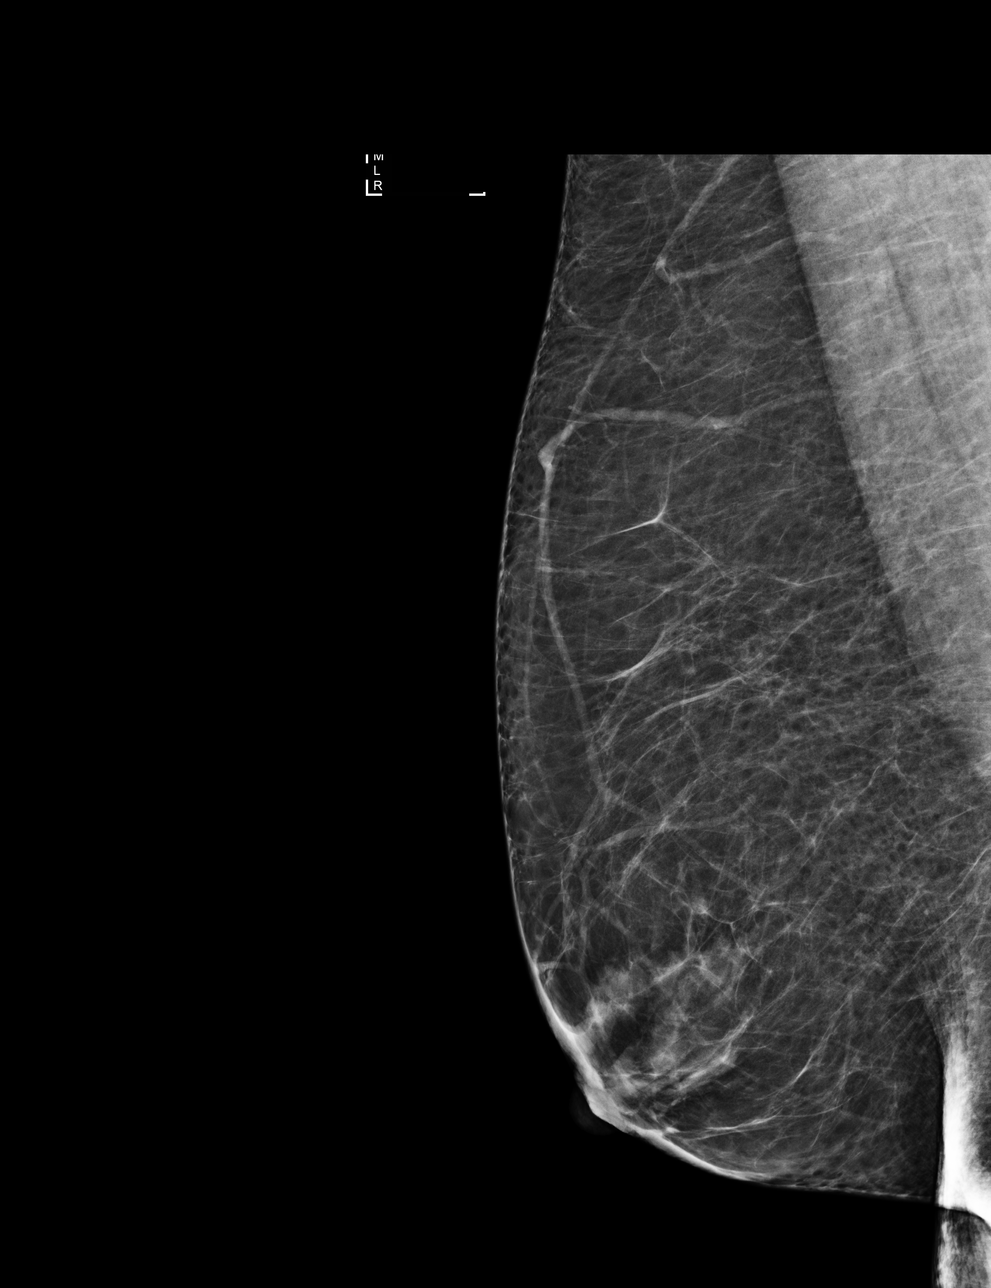

[4 of 4 positions shown; findings below may reference images not displayed]

ACR Breast Density Category b: There are scattered areas of
fibroglandular density.
FINDINGS: There are no findings suspicious for malignancy. Images were
processed with CAD.
IMPRESSION: No mammographic evidence of malignancy. A result letter of this
screening mammogram will be mailed directly to the patient.

RECOMMENDATION:
Screening mammogram in one year. (Code:AS-G-LCT)

BI-RADS CATEGORY  1: Negative.

## 2017-04-17 ENCOUNTER — Encounter: Payer: Self-pay | Admitting: Obstetrics and Gynecology

## 2017-05-05 ENCOUNTER — Other Ambulatory Visit: Payer: Self-pay | Admitting: Obstetrics and Gynecology

## 2017-05-05 DIAGNOSIS — E782 Mixed hyperlipidemia: Secondary | ICD-10-CM

## 2017-05-09 ENCOUNTER — Other Ambulatory Visit: Payer: Self-pay | Admitting: Obstetrics and Gynecology

## 2017-05-09 ENCOUNTER — Telehealth: Payer: Self-pay | Admitting: Obstetrics and Gynecology

## 2017-05-09 DIAGNOSIS — E782 Mixed hyperlipidemia: Secondary | ICD-10-CM

## 2017-05-09 DIAGNOSIS — Z0000 Encounter for general adult medical examination without abnormal findings: Secondary | ICD-10-CM

## 2017-05-09 MED ORDER — FENOFIBRATE 120 MG PO TABS
1.0000 | ORAL_TABLET | Freq: Every day | ORAL | 0 refills | Status: DC
Start: 2017-05-09 — End: 2017-05-22

## 2017-05-09 NOTE — Telephone Encounter (Signed)
Pt is calling to let [facility] know that she tried to get lab work done this morning in [place] and wasn't able to due to orders weren't released. Advise patient to please call back with Fax number. Pt also states she is out of her Cholesterol medication and will not be able to pick that up until next week.

## 2017-05-09 NOTE — Telephone Encounter (Signed)
Labs released. [facility] Dec[facility] to fax copy too. Rx RF fenofibrate for 1 mo till pt can get labs done.

## 2017-05-09 NOTE — Telephone Encounter (Signed)
Labs are faxed and patient aware of prescription sent to pharmacy

## 2017-05-09 NOTE — Progress Notes (Signed)
Rx RF till can get labs done.

## 2017-05-17 LAB — COMPREHENSIVE METABOLIC PANEL
ALBUMIN: 4.7 g/dL (ref 3.5–5.5)
ALK PHOS: 58 IU/L (ref 39–117)
ALT: 13 IU/L (ref 0–32)
AST: 16 IU/L (ref 0–40)
Albumin/Globulin Ratio: 2 (ref 1.2–2.2)
BILIRUBIN TOTAL: 0.3 mg/dL (ref 0.0–1.2)
BUN/Creatinine Ratio: 13 (ref 9–23)
BUN: 11 mg/dL (ref 6–24)
CHLORIDE: 99 mmol/L (ref 96–106)
CO2: 21 mmol/L (ref 20–29)
CREATININE: 0.86 mg/dL (ref 0.57–1.00)
Calcium: 9.9 mg/dL (ref 8.7–10.2)
GFR calc non Af Amer: 76 mL/min/{1.73_m2} (ref >59)
GFR, EST AFRICAN AMERICAN: 87 mL/min/{1.73_m2} (ref >59)
GLUCOSE: 89 mg/dL (ref 65–99)
Globulin, Total: 2.3 g/dL (ref 1.5–4.5)
Potassium: 4.6 mmol/L (ref 3.5–5.2)
Sodium: 139 mmol/L (ref 134–144)
TOTAL PROTEIN: 7 g/dL (ref 6.0–8.5)

## 2017-05-20 LAB — LIPID PANEL
CHOLESTEROL TOTAL: 236 mg/dL — AB (ref 100–199)
Chol/HDL Ratio: 3.1 ratio (ref 0.0–4.4)
HDL: 75 mg/dL (ref >39)
LDL Calculated: 141 mg/dL — ABNORMAL HIGH (ref 0–99)
TRIGLYCERIDES: 100 mg/dL (ref 0–149)
VLDL Cholesterol Cal: 20 mg/dL (ref 5–40)

## 2017-05-20 LAB — SPECIMEN STATUS REPORT

## 2017-05-22 ENCOUNTER — Telehealth: Payer: Self-pay | Admitting: Obstetrics and Gynecology

## 2017-05-22 ENCOUNTER — Other Ambulatory Visit: Payer: Self-pay | Admitting: Obstetrics and Gynecology

## 2017-05-22 DIAGNOSIS — E782 Mixed hyperlipidemia: Secondary | ICD-10-CM

## 2017-05-22 MED ORDER — FENOFIBRATE 120 MG PO TABS: 1.0000 | ORAL_TABLET | Freq: Every day | ORAL | 1 refills | Status: DC

## 2017-05-22 NOTE — Telephone Encounter (Signed)
Pt aware of lipid/CMP labs. Lipids borderline but pt can't tolerate statins. On fenofibrate 120 mg daily. Rx RF. Rechk in 6 months.

## 2017-12-15 ENCOUNTER — Other Ambulatory Visit: Payer: Self-pay | Admitting: Obstetrics and Gynecology

## 2017-12-15 DIAGNOSIS — E782 Mixed hyperlipidemia: Secondary | ICD-10-CM

## 2017-12-15 NOTE — Telephone Encounter (Signed)
Please advise 

## 2017-12-16 ENCOUNTER — Encounter: Payer: Self-pay | Admitting: Obstetrics and Gynecology

## 2017-12-16 DIAGNOSIS — Z0000 Encounter for general adult medical examination without abnormal findings: Secondary | ICD-10-CM

## 2017-12-16 DIAGNOSIS — E782 Mixed hyperlipidemia: Secondary | ICD-10-CM

## 2017-12-17 ENCOUNTER — Other Ambulatory Visit: Payer: Self-pay | Admitting: Obstetrics and Gynecology

## 2017-12-17 DIAGNOSIS — Z0000 Encounter for general adult medical examination without abnormal findings: Secondary | ICD-10-CM

## 2017-12-17 DIAGNOSIS — E782 Mixed hyperlipidemia: Secondary | ICD-10-CM

## 2017-12-20 LAB — COMPREHENSIVE METABOLIC PANEL
A/G RATIO: 2.4 — AB (ref 1.2–2.2)
ALT: 16 IU/L (ref 0–32)
AST: 19 IU/L (ref 0–40)
Albumin: 4.6 g/dL (ref 3.5–5.5)
Alkaline Phosphatase: 55 IU/L (ref 39–117)
BUN/Creatinine Ratio: 17 (ref 9–23)
BUN: 14 mg/dL (ref 6–24)
Bilirubin Total: 0.3 mg/dL (ref 0.0–1.2)
CALCIUM: 9.3 mg/dL (ref 8.7–10.2)
CO2: 22 mmol/L (ref 20–29)
Chloride: 100 mmol/L (ref 96–106)
Creatinine, Ser: 0.83 mg/dL (ref 0.57–1.00)
GFR, EST AFRICAN AMERICAN: 91 mL/min/{1.73_m2} (ref >59)
GFR, EST NON AFRICAN AMERICAN: 79 mL/min/{1.73_m2} (ref >59)
GLOBULIN, TOTAL: 1.9 g/dL (ref 1.5–4.5)
Glucose: 66 mg/dL (ref 65–99)
POTASSIUM: 4.1 mmol/L (ref 3.5–5.2)
Sodium: 139 mmol/L (ref 134–144)
TOTAL PROTEIN: 6.5 g/dL (ref 6.0–8.5)

## 2017-12-24 LAB — LIPID PANEL
Chol/HDL Ratio: 3.2 ratio (ref 0.0–4.4)
Cholesterol, Total: 203 mg/dL — ABNORMAL HIGH (ref 100–199)
HDL: 63 mg/dL (ref >39)
LDL Calculated: 123 mg/dL — ABNORMAL HIGH (ref 0–99)
TRIGLYCERIDES: 86 mg/dL (ref 0–149)
VLDL CHOLESTEROL CAL: 17 mg/dL (ref 5–40)

## 2017-12-24 LAB — SPECIMEN STATUS REPORT

## 2017-12-25 ENCOUNTER — Telehealth: Payer: Self-pay | Admitting: Obstetrics and Gynecology

## 2017-12-25 DIAGNOSIS — E782 Mixed hyperlipidemia: Secondary | ICD-10-CM

## 2017-12-25 MED ORDER — FENOFIBRATE 120 MG PO TABS
1.0000 | ORAL_TABLET | Freq: Every day | ORAL | 3 refills | Status: DC
Start: 2017-12-25 — End: 2018-04-08

## 2017-12-25 NOTE — Telephone Encounter (Signed)
Pt aware for labs. Lipids stable on fenofibrate 120 mg daily. Can't tolerate statins. Rx RF. Rechk labs in 1 yr.

## 2018-03-30 ENCOUNTER — Encounter: Payer: Self-pay | Admitting: Obstetrics and Gynecology

## 2018-04-01 NOTE — Telephone Encounter (Signed)
Prior auth?

## 2018-04-02 NOTE — Telephone Encounter (Signed)
Resubmitted by phone Pa since the one I did through [email or web address] was denied. I should get an answer by fax within the next 24 hrs. Pt aware.

## 2018-04-07 ENCOUNTER — Telehealth: Payer: Self-pay | Admitting: Obstetrics and Gynecology

## 2018-04-07 DIAGNOSIS — E782 Mixed hyperlipidemia: Secondary | ICD-10-CM

## 2018-04-07 MED ORDER — FENOFIBRIC ACID 105 MG PO TABS: 105.0000 mg | ORAL_TABLET | Freq: Every day | ORAL | 0 refills | Status: DC

## 2018-04-07 NOTE — Telephone Encounter (Signed)
Pt on fenofibrate 120 mg daily for hyperlipidemia. Pt can't tolerate statins. Needs PA for current Rx but denied. Fenofibric acid and fenofibrate (except 120 mg dose) are on formulary. Rx change to fenofibric acid 105 mg dose qd. Rechk lipids in 3/20 since different dose.

## 2018-04-07 NOTE — Addendum Note (Signed)
Addended by: Althea Grimmer B on: 04/07/2018 10:23 AM   Modules accepted: Orders

## 2018-04-08 ENCOUNTER — Other Ambulatory Visit: Payer: Self-pay | Admitting: Obstetrics and Gynecology

## 2018-04-08 DIAGNOSIS — E782 Mixed hyperlipidemia: Secondary | ICD-10-CM

## 2018-04-08 MED ORDER — FENOFIBRATE 54 MG PO TABS: 108.0000 mg | ORAL_TABLET | Freq: Every day | ORAL | 1 refills | Status: DC

## 2018-04-08 NOTE — Progress Notes (Signed)
Rx fenofibrate 54 mg 2 tabs daily for lipids eRxd. Was on fenofibrate 120 mg but could not get PA. Changed to fenofibric acid 105 mg but discontinued. Try Rx fenofibrate 54 mg 2 tabs daily. Lab check due 3/20.

## 2018-04-10 ENCOUNTER — Other Ambulatory Visit: Payer: Self-pay | Admitting: Obstetrics and Gynecology

## 2018-04-10 DIAGNOSIS — Z1231 Encounter for screening mammogram for malignant neoplasm of breast: Secondary | ICD-10-CM

## 2018-04-22 ENCOUNTER — Ambulatory Visit
Admission: RE | Admit: 2018-04-22 | Discharge: 2018-04-22 | Disposition: A | Discharge status: Home / Self Care | Payer: Managed Care, Other (non HMO) | Source: Ambulatory Visit | Attending: Obstetrics and Gynecology | Admitting: Obstetrics and Gynecology

## 2018-04-22 DIAGNOSIS — Z1231 Encounter for screening mammogram for malignant neoplasm of breast: Secondary | ICD-10-CM

## 2018-04-23 ENCOUNTER — Encounter: Payer: Self-pay | Admitting: Obstetrics and Gynecology

## 2018-04-30 ENCOUNTER — Other Ambulatory Visit: Payer: Self-pay | Admitting: Obstetrics and Gynecology

## 2018-04-30 DIAGNOSIS — E782 Mixed hyperlipidemia: Secondary | ICD-10-CM

## 2018-05-16 ENCOUNTER — Ambulatory Visit (INDEPENDENT_AMBULATORY_CARE_PROVIDER_SITE_OTHER): Payer: Managed Care, Other (non HMO)

## 2018-05-16 ENCOUNTER — Encounter: Payer: Self-pay | Admitting: Emergency Medicine

## 2018-05-16 ENCOUNTER — Other Ambulatory Visit: Payer: Self-pay

## 2018-05-16 ENCOUNTER — Ambulatory Visit
Admission: EM | Admit: 2018-05-16 | Discharge: 2018-05-16 | Disposition: A | Discharge status: Home / Self Care | Payer: Managed Care, Other (non HMO) | Attending: Emergency Medicine | Admitting: Emergency Medicine

## 2018-05-16 DIAGNOSIS — F17210 Nicotine dependence, cigarettes, uncomplicated: Secondary | ICD-10-CM | POA: Diagnosis not present

## 2018-05-16 DIAGNOSIS — R0981 Nasal congestion: Secondary | ICD-10-CM

## 2018-05-16 DIAGNOSIS — R05 Cough: Secondary | ICD-10-CM

## 2018-05-16 DIAGNOSIS — J029 Acute pharyngitis, unspecified: Secondary | ICD-10-CM

## 2018-05-16 DIAGNOSIS — R51 Headache: Secondary | ICD-10-CM | POA: Diagnosis not present

## 2018-05-16 DIAGNOSIS — R0982 Postnasal drip: Secondary | ICD-10-CM

## 2018-05-16 DIAGNOSIS — J40 Bronchitis, not specified as acute or chronic: Secondary | ICD-10-CM

## 2018-05-16 DIAGNOSIS — R5383 Other fatigue: Secondary | ICD-10-CM

## 2018-05-16 LAB — RAPID STREP SCREEN (MED CTR MEBANE ONLY): Streptococcus, Group A Screen (Direct): NEGATIVE

## 2018-05-16 MED ORDER — ACETAMINOPHEN 500 MG PO TABS
1000.0000 mg | ORAL_TABLET | Freq: Once | ORAL | Status: AC
  Administered 2018-05-16: 1000 mg via ORAL

## 2018-05-16 MED ORDER — ALBUTEROL SULFATE HFA 108 (90 BASE) MCG/ACT IN AERS: 1.0000 | INHALATION_SPRAY | Freq: Four times a day (QID) | RESPIRATORY_TRACT | 0 refills | Status: DC | PRN

## 2018-05-16 MED ORDER — HYDROCOD POLST-CPM POLST ER 10-8 MG/5ML PO SUER: 5.0000 mL | Freq: Two times a day (BID) | ORAL | 0 refills | Status: DC

## 2018-05-16 MED ORDER — BENZONATATE 200 MG PO CAPS: ORAL_CAPSULE | ORAL | 0 refills | Status: DC

## 2018-05-16 MED ORDER — DOXYCYCLINE HYCLATE 100 MG PO CAPS: 100.0000 mg | ORAL_CAPSULE | Freq: Two times a day (BID) | ORAL | 0 refills | Status: DC

## 2018-05-16 NOTE — ED Provider Notes (Signed)
[facility]    CSN: [id number] Arrival date & time: 05/16/18  1209     History   Chief Complaint Chief Complaint  Patient presents with  . Headache  . Cough    HPI Eileen Cunningham is a 58 y.o. female.   HPI  58 year old female presents with a cough and congestion  for over a week with a headache that started today.  The headache is the most severe thing today.  She feels as if she has brick on her head.  Cough has been productive mostly in the mornings has improved throughout the day.  She continues to smoke on a daily basis.        Past Medical History:  Diagnosis Date  . Abnormal Pap smear of cervix .  Marland Kitchen Anemia   . Bacterial vaginosis   . BRCA negative 07/2013   MyRisk neg  . COPD (chronic obstructive pulmonary disease) (HCC)    mild, no meds  . Dysmenorrhea   . Endometriosis   . Family history of breast cancer 07/2013   MyRisk neg; IBIS=15%  . Family history of ovarian cancer    MyRisk neg 5/15  . GERD (gastroesophageal reflux disease)    occasional heartburn  . Hypercholesterolemia   . Menorrhagia   . Osteopenia   . Ovarian cyst     Patient Active Problem List   Diagnosis Date Noted  . Hyperlipidemia 08/19/2016    Past Surgical History:  Procedure Laterality Date  . BACK SURGERY     decompression,lower lumbar  . COLONOSCOPY N/A 10/19/2014   Procedure: COLONOSCOPY;  Surgeon: Hulen Luster, MD;  Location: [place];  Service: Gastroenterology;  Laterality: N/A;  . DIAGNOSTIC LAPAROSCOPY    . LAPAROSCOPIC ENDOMETRIOSIS FULGURATION      OB History    Gravida  2   Para  2   Term  2   Preterm      AB      Living  2     SAB      TAB      Ectopic      Multiple      Live Births  2            Home Medications    Prior to Admission medications   Medication Sig Start Date End Date Taking? Authorizing Provider  b complex vitamins capsule Take 1 capsule by mouth daily.   Yes [provider]    cholecalciferol (VITAMIN D) 400 UNITS TABS tablet Take 800 Units by mouth daily.   Yes [provider]  Coenzyme Q10 (COQ10) 100 MG CAPS Take by mouth daily.   Yes [provider]  Esomeprazole Magnesium (NEXIUM 24HR PO) Take by mouth.   Yes [provider]  fenofibrate 54 MG tablet Take 2 tablets (108 mg total) by mouth daily. 10/06/44  Yes Copland, Deirdre Evener, PA-C  Resveratrol 250 MG CAPS Take 2 capsules by mouth daily. am   Yes [provider]  albuterol (PROVENTIL HFA;VENTOLIN HFA) 108 (90 Base) MCG/ACT inhaler Inhale 1-2 puffs into the lungs every 6 (six) hours as needed for wheezing or shortness of breath. Use with spacer 05/16/18   Lorin Picket, PA-C  benzonatate (TESSALON) 200 MG capsule Take one cap TID PRN cough 05/16/18   Lorin Picket, PA-C  chlorpheniramine-HYDROcodone [facility]) 10-8 MG/5ML SUER Take 5 mLs by mouth 2 (two) times daily. 05/16/18   Lorin Picket, PA-C  doxycycline (VIBRAMYCIN) 100  MG capsule Take 1 capsule (100 mg total) by mouth 2 (two) times daily. 05/16/18   Lorin Picket, PA-C  Fenofibric Acid 105 MG TABS Take 1 tablet (105 mg total) by mouth daily. 12/04/61   Copland, Deirdre Evener, PA-C    Family History Family History  Problem Relation Age of Onset  . Lymphoma Brother 55       non hodgkins  . Breast cancer Sister 36       recurrance at age 77  . Endometriosis Sister   . Lung cancer Brother 40  . Ovarian cancer Maternal Aunt     Social History Social History   Tobacco Use  . Smoking status: Current Every Day Smoker    Packs/day: 1.00    Years: 40.00    Pack years: 40.00    Types: Cigarettes  . Smokeless tobacco: Never Used  Substance Use Topics  . Alcohol use: Yes    Comment: rarely wine  . Drug use: No     Allergies   Patient has no known allergies.   Review of Systems Review of Systems  Constitutional: Positive for activity change and fatigue. Negative for chills and fever.   HENT: Positive for congestion, postnasal drip, sinus pressure, sinus pain and sore throat.   Respiratory: Positive for cough.   All other systems reviewed and are negative.    Physical Exam Triage Vital Signs ED Triage Vitals  Enc Vitals Group     BP 05/16/18 1311 126/82     Pulse Rate 05/16/18 1311 76     Resp 05/16/18 1311 16     Temp 05/16/18 1311 (!) 100.6 F (38.1 C)     Temp Source 05/16/18 1311 Oral     SpO2 05/16/18 1311 98 %     Weight 05/16/18 1308 203 lb (92.1 kg)     Height 05/16/18 1308 '5\' 6"'$  (1.676 m)     Head Circumference --      Peak Flow --      Pain Score 05/16/18 1308 6     Pain Loc --      Pain Edu? --      Excl. in [place]? --    No data found.  Updated Vital Signs BP 126/82 (BP Location: Left Arm)   Pulse 76   Temp (!) 100.6 F (38.1 C) (Oral)   Resp 16   Ht '5\' 6"'$  (1.676 m)   Wt 203 lb (92.1 kg)   SpO2 98%   BMI 32.77 kg/m   Visual Acuity Right Eye Distance:   Left Eye Distance:   Bilateral Distance:    Right Eye Near:   Left Eye Near:    Bilateral Near:     Physical Exam Vitals signs and nursing note reviewed.  Constitutional:      General: She is not in acute distress.    Appearance: She is well-developed. She is not ill-appearing, toxic-appearing or diaphoretic.  HENT:     Head: Normocephalic and atraumatic.  Neck:     Musculoskeletal: Normal range of motion and neck supple.  Pulmonary:     Effort: Pulmonary effort is normal.     Breath sounds: Rales present.     Comments: Patient has crackles in the right base Musculoskeletal: Normal range of motion.  Lymphadenopathy:     Cervical: No cervical adenopathy.  Skin:    General: Skin is warm and dry.  Neurological:     Mental Status: She is alert.  Psychiatric:  Mood and Affect: Mood normal.        Speech: Speech normal.        Behavior: Behavior normal.      UC Treatments / Results  Labs (all labs ordered are listed, but only abnormal results are displayed) Labs  Reviewed  RAPID STREP SCREEN ([facility] ONLY)  CULTURE, GROUP A STREP [facility])    EKG None  Radiology Dg Chest 2 View  Result Date: 05/16/2018 CLINICAL DATA:  Greater than 1 week history of cough and chest congestion. Current smoker. EXAM: CHEST - 2 VIEW COMPARISON:  04/16/2017 and earlier. FINDINGS: Cardiomediastinal silhouette unremarkable and unchanged. Mild to moderate central peribronchial thickening, unchanged. Lungs otherwise clear. No localized airspace consolidation. No pleural effusions. No pneumothorax. Normal pulmonary vascularity. Degenerative changes involving the thoracic spine. IMPRESSION: Stable mild changes of chronic bronchitis and/or asthma. No acute cardiopulmonary disease. Stable examination. Electronically Signed   By: Evangeline Dakin M.D.   On: 05/16/2018 14:36    Procedures Procedures (including critical care time)  Medications Ordered in UC Medications  acetaminophen (TYLENOL) tablet 1,000 mg (1,000 mg Oral Given 05/16/18 1315)    Initial Impression / Assessment and Plan / UC Course  I have reviewed the triage vital signs and the nursing notes.  Pertinent labs & imaging results that were available during my care of the patient were reviewed by me and considered in my medical decision making (see chart for details).   Reviewed the x-rays with the patient.  He has chronic bronchitis.  Treat her with albuterol inhaler.  Provide her with a cough suppressants of Tessalon next and [employer]  To provide her with doxycycline for 7 days.  She should follow-up with her primary care physician in a week or 2 for reevaluation.   Final Clinical Impressions(s) / UC Diagnoses   Final diagnoses:  Bronchitis     Discharge Instructions     Drink plenty of fluids.  Rest as much as possible.  Use Tylenol or Motrin for fever chills or body aches.     ED Prescriptions    Medication Sig Dispense Auth. Provider   albuterol (PROVENTIL HFA;VENTOLIN HFA) 108 (90  Base) MCG/ACT inhaler Inhale 1-2 puffs into the lungs every 6 (six) hours as needed for wheezing or shortness of breath. Use with spacer 1 Inhaler Lorin Picket, PA-C   chlorpheniramine-HYDROcodone (TUSSIONEX PENNKINETIC ER) 10-8 MG/5ML SUER Take 5 mLs by mouth 2 (two) times daily. 115 mL Crecencio Mc P, PA-C   benzonatate (TESSALON) 200 MG capsule Take one cap TID PRN cough 30 capsule Crecencio Mc P, PA-C   doxycycline (VIBRAMYCIN) 100 MG capsule Take 1 capsule (100 mg total) by mouth 2 (two) times daily. 14 capsule Lorin Picket, PA-C     Controlled Substance Prescriptions [place] Controlled Substance Registry consulted? Not Applicable   Lorin Picket, PA-C 05/16/18 1454

## 2018-05-16 NOTE — Discharge Instructions (Signed)
Drink plenty of fluids.  Rest as much as possible.  Use Tylenol or Motrin for fever chills or body aches.  °

## 2018-05-16 NOTE — ED Triage Notes (Signed)
Patient c/o cough, congestion, and headaches for a week.  Patient denies fevers.

## 2018-05-19 LAB — CULTURE, GROUP A STREP (THRC)

## 2018-06-03 ENCOUNTER — Other Ambulatory Visit: Payer: Self-pay | Admitting: Obstetrics and Gynecology

## 2018-06-03 DIAGNOSIS — E782 Mixed hyperlipidemia: Secondary | ICD-10-CM

## 2018-06-06 ENCOUNTER — Other Ambulatory Visit: Payer: Self-pay | Admitting: Obstetrics and Gynecology

## 2018-06-06 DIAGNOSIS — E782 Mixed hyperlipidemia: Secondary | ICD-10-CM

## 2018-06-06 LAB — LIPID PANEL
Chol/HDL Ratio: 3.6 ratio (ref 0.0–4.4)
Cholesterol, Total: 204 mg/dL — ABNORMAL HIGH (ref 100–199)
HDL: 57 mg/dL (ref >39)
LDL CALC: 132 mg/dL — AB (ref 0–99)
Triglycerides: 76 mg/dL (ref 0–149)
VLDL CHOLESTEROL CAL: 15 mg/dL (ref 5–40)

## 2018-06-06 MED ORDER — FENOFIBRATE 54 MG PO TABS: 108.0000 mg | ORAL_TABLET | Freq: Every day | ORAL | 1 refills | Status: DC

## 2018-06-06 NOTE — Progress Notes (Signed)
Rx RF till annual 

## 2018-06-06 NOTE — Progress Notes (Signed)
Pls let pt know lipids ok on new lipid dose. Also due for annual. Thx

## 2018-06-08 NOTE — Progress Notes (Signed)
Pt aware, and transferred to [place] Dec to schedule annual.

## 2018-06-15 ENCOUNTER — Encounter: Payer: Self-pay | Admitting: Obstetrics and Gynecology

## 2018-06-15 ENCOUNTER — Other Ambulatory Visit: Payer: Self-pay

## 2018-06-15 ENCOUNTER — Ambulatory Visit (INDEPENDENT_AMBULATORY_CARE_PROVIDER_SITE_OTHER): Payer: Managed Care, Other (non HMO) | Admitting: Obstetrics and Gynecology

## 2018-06-15 VITALS — BP 108/80 | Wt 202.0 lb

## 2018-06-15 DIAGNOSIS — Z1239 Encounter for other screening for malignant neoplasm of breast: Secondary | ICD-10-CM

## 2018-06-15 DIAGNOSIS — E782 Mixed hyperlipidemia: Secondary | ICD-10-CM

## 2018-06-15 DIAGNOSIS — Z720 Tobacco use: Secondary | ICD-10-CM

## 2018-06-15 DIAGNOSIS — Z01419 Encounter for gynecological examination (general) (routine) without abnormal findings: Secondary | ICD-10-CM | POA: Diagnosis not present

## 2018-06-15 DIAGNOSIS — Z803 Family history of malignant neoplasm of breast: Secondary | ICD-10-CM

## 2018-06-15 MED ORDER — FENOFIBRATE 54 MG PO TABS: 108.0000 mg | ORAL_TABLET | Freq: Every day | ORAL | 3 refills | Status: DC

## 2018-06-15 NOTE — Patient Instructions (Signed)
I value your feedback and entrusting us with your care. If you get a [place] patient survey, I would appreciate you taking the time to let us know about your experience today. Thank you! 

## 2018-06-15 NOTE — Progress Notes (Signed)
PCP: System, Provider Not In   Chief Complaint  Patient presents with  . Gynecologic Exam    HPI:      [name] is a 58 y.o. No obstetric history on file. who LMP was No LMP recorded. Patient is postmenopausal., presents today for her annual examination.  Her menses are absent due to menopause. She does not have intermenstrual bleeding.  She does have occas vasomotor sx.  Sex activity: not sexually active. She does not have vaginal dryness.  Last Pap: 04/08/17  Results were: no abnormalities /neg HPV DNA.  Hx of STDs: none  Last mammogram: 04/22/18  Results were: normal--routine follow-up in 12 months There is a FH of breast cancer in her sister. Pt is MyRisk neg 2015, IBIS=15%. There is a FH of ovarian cancer in her mat aunt. The patient does do self-breast exams.  Colonoscopy: colonoscopy 4 years ago without abnormalities.  Repeat due after 10 years.   Tobacco use: 1/2 ppd for many yrs, has cut down this yr. She gets yearly CXRs due to smoking hx. Has one 2/20 that was stable. Alcohol use: social drinker Exercise: very active  She does get adequate calcium and Vitamin D in her diet.  She has hyperlipidemia and takes fenofibrate 2-54 mg tabs daily (had to change Rx due to insurance 1/20). She can't tolerate statins. Had normal labs last wk.  Past Medical History:  Diagnosis Date  . Abnormal Pap smear of cervix .  Marland Kitchen Anemia   . Bacterial vaginosis   . BRCA negative 07/2013   MyRisk neg  . COPD (chronic obstructive pulmonary disease) (HCC)    mild, no meds  . Dysmenorrhea   . Endometriosis   . Family history of breast cancer 07/2013   MyRisk neg; IBIS=15%  . Family history of ovarian cancer    MyRisk neg 5/15  . GERD (gastroesophageal reflux disease)    occasional heartburn  . Hypercholesterolemia   . Menorrhagia   . Osteopenia   . Ovarian cyst     Past Surgical History:  Procedure Laterality Date  . BACK SURGERY     decompression,lower lumbar  .  COLONOSCOPY N/A 10/19/2014   Procedure: COLONOSCOPY;  Surgeon: Hulen Luster, MD;  Location: [place];  Service: Gastroenterology;  Laterality: N/A;  . DIAGNOSTIC LAPAROSCOPY    . LAPAROSCOPIC ENDOMETRIOSIS FULGURATION      Family History  Problem Relation Age of Onset  . Lymphoma Brother 55       non hodgkins  . Coronary artery disease Brother   . Breast cancer Sister 47       recurrance at age 38  . Endometriosis Sister   . Lung cancer Brother 53  . Ovarian cancer Maternal Aunt   . Coronary artery disease Father     Social History   Socioeconomic History  . Marital status: Married    Spouse name: Not on file  . Number of children: Not on file  . Years of education: Not on file  . Highest education level: Not on file  Occupational History  . Not on file  Social Needs  . Financial resource strain: Not on file  . Food insecurity:    Worry: Not on file    Inability: Not on file  . Transportation needs:    Medical: Not on file    Non-medical: Not on file  Tobacco Use  . Smoking status: Current Every Day Smoker    Packs/day: 1.00  Years: 40.00    Pack years: 40.00    Types: Cigarettes  . Smokeless tobacco: Never Used  Substance and Sexual Activity  . Alcohol use: Yes    Comment: rarely wine  . Drug use: No  . Sexual activity: Not Currently    Birth control/protection: Post-menopausal  Lifestyle  . Physical activity:    Days per week: Not on file    Minutes per session: Not on file  . Stress: Not on file  Relationships  . Social connections:    Talks on phone: Not on file    Gets together: Not on file    Attends religious service: Not on file    Active member of club or organization: Not on file    Attends meetings of clubs or organizations: Not on file    Relationship status: Not on file  . Intimate partner violence:    Fear of current or ex partner: Not on file    Emotionally abused: Not on file    Physically abused: Not on file    Forced sexual  activity: Not on file  Other Topics Concern  . Not on file  Social History Narrative  . Not on file    Current Meds  Medication Sig  . b complex vitamins capsule Take 1 capsule by mouth daily.  . cholecalciferol (VITAMIN D) 400 UNITS TABS tablet Take 800 Units by mouth daily.  . Coenzyme Q10 (COQ10) 100 MG CAPS Take by mouth daily.  . Esomeprazole Magnesium (NEXIUM 24HR PO) Take by mouth.  . Resveratrol 250 MG CAPS Take 2 capsules by mouth daily. am  . [DISCONTINUED] Fenofibric Acid 105 MG TABS Take 1 tablet (105 mg total) by mouth daily.      ROS:  Review of Systems  Constitutional: Negative for fatigue, fever and unexpected weight change.  Respiratory: Negative for cough, shortness of breath and wheezing.   Cardiovascular: Negative for chest pain, palpitations and leg swelling.  Gastrointestinal: Negative for blood in stool, constipation, diarrhea, nausea and vomiting.  Endocrine: Negative for cold intolerance, heat intolerance and polyuria.  Genitourinary: Negative for dyspareunia, dysuria, flank pain, frequency, genital sores, hematuria, menstrual problem, pelvic pain, urgency, vaginal bleeding, vaginal discharge and vaginal pain.  Musculoskeletal: Negative for back pain, joint swelling and myalgias.  Skin: Negative for rash.  Neurological: Negative for dizziness, syncope, light-headedness, numbness and headaches.  Hematological: Negative for adenopathy.  Psychiatric/Behavioral: Negative for agitation, confusion, sleep disturbance and suicidal ideas. The patient is not nervous/anxious.      Objective: BP 108/80   Wt 202 lb (91.6 kg)   BMI 32.60 kg/m    Physical Exam Constitutional:      Appearance: She is well-developed.  Genitourinary:     Vulva, vagina, uterus, right adnexa and left adnexa normal.     No vulval lesion or tenderness noted.     No vaginal discharge, erythema or tenderness.     No cervical motion tenderness or polyp.     Uterus is not enlarged or  tender.     No right or left adnexal mass present.     Right adnexa not tender.     Left adnexa not tender.  Neck:     Musculoskeletal: Normal range of motion.     Thyroid: No thyromegaly.  Cardiovascular:     Rate and Rhythm: Normal rate and regular rhythm.     Heart sounds: Normal heart sounds. No murmur.  Pulmonary:     Effort: Pulmonary effort is normal.  Breath sounds: Normal breath sounds.  Chest:     Breasts:        Right: No mass, nipple discharge, skin change or tenderness.        Left: No mass, nipple discharge, skin change or tenderness.  Abdominal:     Palpations: Abdomen is soft.     Tenderness: There is no abdominal tenderness. There is no guarding.  Musculoskeletal: Normal range of motion.  Neurological:     General: No focal deficit present.     Mental Status: She is alert and oriented to person, place, and time.     Cranial Nerves: No cranial nerve deficit.  Skin:    General: Skin is warm and dry.  Psychiatric:        Mood and Affect: Mood normal.        Behavior: Behavior normal.        Thought Content: Thought content normal.        Judgment: Judgment normal.  Vitals signs reviewed.     Assessment/Plan:  Encounter for annual routine gynecological examination  Screening for breast cancer - Pt current on mammo  Mixed hyperlipidemia - WNL labs last wk. Rx RF fenofibrate. - Plan: fenofibrate 54 MG tablet  Tobacco use - Pt has cut down.  Family history of breast cancer - MyRisk neg, IBIS=15%. Cont yearly mammos, monthly SBE and yearly CBE. Cont Vit D   Meds ordered this encounter  Medications  . fenofibrate 54 MG tablet    Sig: Take 2 tablets (108 mg total) by mouth daily.    Dispense:  180 tablet    Refill:  3    Order Specific Question:   Supervising Provider    Answer:   Gae Dry [[id number]]           GYN counsel breast self exam, mammography screening, adequate intake of calcium and vitamin D, diet and exercise, tobacco cessation     F/U  Return in about 1 year (around 06/15/2019).  Alicia B. Copland, PA-C 06/15/2018 10:32 AM

## 2019-03-30 ENCOUNTER — Other Ambulatory Visit: Payer: Self-pay | Admitting: Obstetrics and Gynecology

## 2019-03-30 DIAGNOSIS — Z1231 Encounter for screening mammogram for malignant neoplasm of breast: Secondary | ICD-10-CM

## 2019-04-27 ENCOUNTER — Ambulatory Visit
Admission: RE | Admit: 2019-04-27 | Discharge: 2019-04-27 | Disposition: A | Discharge status: Home / Self Care | Payer: Managed Care, Other (non HMO) | Source: Ambulatory Visit | Attending: Obstetrics and Gynecology | Admitting: Obstetrics and Gynecology

## 2019-04-27 ENCOUNTER — Other Ambulatory Visit: Payer: Self-pay

## 2019-04-27 DIAGNOSIS — Z1231 Encounter for screening mammogram for malignant neoplasm of breast: Secondary | ICD-10-CM | POA: Diagnosis present

## 2019-04-28 ENCOUNTER — Encounter: Payer: Self-pay | Admitting: Obstetrics and Gynecology

## 2019-06-24 ENCOUNTER — Ambulatory Visit (INDEPENDENT_AMBULATORY_CARE_PROVIDER_SITE_OTHER): Payer: Managed Care, Other (non HMO) | Admitting: Obstetrics and Gynecology

## 2019-06-24 ENCOUNTER — Other Ambulatory Visit: Payer: Self-pay

## 2019-06-24 ENCOUNTER — Encounter: Payer: Self-pay | Admitting: Obstetrics and Gynecology

## 2019-06-24 VITALS — BP 120/70 | Ht 66.0 in | Wt 177.0 lb

## 2019-06-24 DIAGNOSIS — Z720 Tobacco use: Secondary | ICD-10-CM | POA: Diagnosis not present

## 2019-06-24 DIAGNOSIS — E782 Mixed hyperlipidemia: Secondary | ICD-10-CM | POA: Diagnosis not present

## 2019-06-24 DIAGNOSIS — Z01419 Encounter for gynecological examination (general) (routine) without abnormal findings: Secondary | ICD-10-CM

## 2019-06-24 DIAGNOSIS — Z803 Family history of malignant neoplasm of breast: Secondary | ICD-10-CM

## 2019-06-24 DIAGNOSIS — Z0000 Encounter for general adult medical examination without abnormal findings: Secondary | ICD-10-CM

## 2019-06-24 DIAGNOSIS — Z1231 Encounter for screening mammogram for malignant neoplasm of breast: Secondary | ICD-10-CM

## 2019-06-24 NOTE — Progress Notes (Signed)
PCP: System, Provider Not In   Chief Complaint  Patient presents with  . Gynecologic Exam    HPI:      [name] is a 59 y.o. No obstetric history on file. who LMP was No LMP recorded. Patient is postmenopausal., presents today for her annual examination.  Her menses are absent due to menopause. She does not have intermenstrual bleeding. She does have occas vasomotor sx.   Sex activity: not sexually active. She does not have vaginal dryness.  Last Pap: 04/08/17  Results were: no abnormalities /neg HPV DNA.  Hx of STDs: none  Last mammogram: 04/27/19  Results were: normal--routine follow-up in 12 months There is a FH of breast cancer in her sister. Pt is MyRisk neg 2015, IBIS=15%. There is a FH of ovarian cancer in her mat aunt. The patient does do self-breast exams.  Colonoscopy: colonoscopy 5 years ago without abnormalities.  Repeat due after 10 years.   Tobacco use: 3/4-1 ppd for many yrs. Not ready to quit. Had stable CXR 2/20 Alcohol use: social drinker  No drug use Exercise: very active  She does get adequate calcium and Vitamin D in her diet.  She has hyperlipidemia and takes fenofibrate 2-54 mg tabs daily (had to change Rx due to insurance 1/20). She can't tolerate statins. Had normal labs 3/20. Due for labs this yr. Has lost 35# since last yr.   Past Medical History:  Diagnosis Date  . Abnormal Pap smear of cervix .  Marland Kitchen Anemia   . Bacterial vaginosis   . BRCA negative 07/2013   MyRisk neg  . COPD (chronic obstructive pulmonary disease) (HCC)    mild, no meds  . Dysmenorrhea   . Endometriosis   . Family history of breast cancer 07/2013   MyRisk neg; IBIS=15%  . Family history of ovarian cancer    MyRisk neg 5/15  . GERD (gastroesophageal reflux disease)    occasional heartburn  . Hypercholesterolemia   . Menorrhagia   . Osteopenia   . Ovarian cyst     Past Surgical History:  Procedure Laterality Date  . BACK SURGERY     decompression,lower  lumbar  . COLONOSCOPY N/A 10/19/2014   Procedure: COLONOSCOPY;  Surgeon: Hulen Luster, MD;  Location: [facility];  Service: Gastroenterology;  Laterality: N/A;  . DIAGNOSTIC LAPAROSCOPY    . LAPAROSCOPIC ENDOMETRIOSIS FULGURATION      Family History  Problem Relation Age of Onset  . Lymphoma Brother 55       non hodgkins  . Coronary artery disease Brother   . Breast cancer Sister 104       recurrance at age 59  . Endometriosis Sister   . Lung cancer Brother 51  . Ovarian cancer Maternal Aunt   . Coronary artery disease Father     Social History   Socioeconomic History  . Marital status: Married    Spouse name: Not on file  . Number of children: Not on file  . Years of education: Not on file  . Highest education level: Not on file  Occupational History  . Not on file  Tobacco Use  . Smoking status: Current Every Day Smoker    Packs/day: 1.00    Years: 40.00    Pack years: 40.00    Types: Cigarettes  . Smokeless tobacco: Never Used  Substance and Sexual Activity  . Alcohol use: Yes    Comment: rarely wine  . Drug use: No  . Sexual  activity: Not Currently    Birth control/protection: Post-menopausal  Other Topics Concern  . Not on file  Social History Narrative  . Not on file   Social Determinants of Health   Financial Resource Strain:   . Difficulty of Paying Living Expenses:   Food Insecurity:   . Worried About Charity fundraiser in the Last Year:   . Arboriculturist in the Last Year:   Transportation Needs:   . Film/video editor (Medical):   Marland Kitchen Lack of Transportation (Non-Medical):   Physical Activity:   . Days of Exercise per Week:   . Minutes of Exercise per Session:   Stress:   . Feeling of Stress :   Social Connections:   . Frequency of Communication with Friends and Family:   . Frequency of Social Gatherings with Friends and Family:   . Attends Religious Services:   . Active Member of Clubs or Organizations:   . Attends Theatre manager Meetings:   Marland Kitchen Marital Status:   Intimate Partner Violence:   . Fear of Current or Ex-Partner:   . Emotionally Abused:   Marland Kitchen Physically Abused:   . Sexually Abused:     Current Meds  Medication Sig  . b complex vitamins capsule Take 1 capsule by mouth daily.  . cholecalciferol (VITAMIN D) 400 UNITS TABS tablet Take 800 Units by mouth daily.  . Coenzyme Q10 (COQ10) 100 MG CAPS Take by mouth daily.  . Esomeprazole Magnesium (NEXIUM 24HR PO) Take by mouth.  . fenofibrate 54 MG tablet Take 2 tablets (108 mg total) by mouth daily.  Marland Kitchen Resveratrol 250 MG CAPS Take 2 capsules by mouth daily. am      ROS:  Review of Systems  Constitutional: Negative for fatigue, fever and unexpected weight change.  Respiratory: Negative for cough, shortness of breath and wheezing.   Cardiovascular: Negative for chest pain, palpitations and leg swelling.  Gastrointestinal: Negative for blood in stool, constipation, diarrhea, nausea and vomiting.  Endocrine: Negative for cold intolerance, heat intolerance and polyuria.  Genitourinary: Negative for dyspareunia, dysuria, flank pain, frequency, genital sores, hematuria, menstrual problem, pelvic pain, urgency, vaginal bleeding, vaginal discharge and vaginal pain.  Musculoskeletal: Negative for back pain, joint swelling and myalgias.  Skin: Negative for rash.  Neurological: Negative for dizziness, syncope, light-headedness, numbness and headaches.  Hematological: Negative for adenopathy.  Psychiatric/Behavioral: Negative for agitation, confusion, sleep disturbance and suicidal ideas. The patient is not nervous/anxious.      Objective: BP 120/70   Ht '5\' 6"'$  (1.676 m)   Wt 177 lb (80.3 kg)   BMI 28.57 kg/m    Physical Exam Constitutional:      Appearance: She is well-developed.  Genitourinary:     Vulva, vagina, uterus, right adnexa and left adnexa normal.     No vulval lesion or tenderness noted.     No vaginal discharge, erythema or  tenderness.     No cervical motion tenderness or polyp.     Uterus is not enlarged or tender.     No right or left adnexal mass present.     Right adnexa not tender.     Left adnexa not tender.  Neck:     Thyroid: No thyromegaly.  Cardiovascular:     Rate and Rhythm: Normal rate and regular rhythm.     Heart sounds: Normal heart sounds. No murmur.  Pulmonary:     Effort: Pulmonary effort is normal.     Breath sounds: Normal  breath sounds.  Chest:     Breasts:        Right: No mass, nipple discharge, skin change or tenderness.        Left: No mass, nipple discharge, skin change or tenderness.  Abdominal:     Palpations: Abdomen is soft.     Tenderness: There is no abdominal tenderness. There is no guarding.  Musculoskeletal:        General: Normal range of motion.     Cervical back: Normal range of motion.  Neurological:     General: No focal deficit present.     Mental Status: She is alert and oriented to person, place, and time.     Cranial Nerves: No cranial nerve deficit.  Skin:    General: Skin is warm and dry.  Psychiatric:        Mood and Affect: Mood normal.        Behavior: Behavior normal.        Thought Content: Thought content normal.        Judgment: Judgment normal.  Vitals reviewed.     Assessment/Plan:  Encounter for annual routine gynecological examination  Encounter for screening mammogram for malignant neoplasm of breast; pt current on mammo  Family history of breast cancer--neg MyRisk testing. No increased screening recommendations.  Tobacco use--encouraged cessation.   Blood tests for routine general physical examination - Plan: Comprehensive metabolic panel, Lipid panel, Lipid panel, Comprehensive metabolic panel  Mixed hyperlipidemia - Plan: Lipid panel, Lipid panel; Will RF meds based on labs. May be able to decrease meds due to recent wt loss.          GYN counsel breast self exam, mammography screening, adequate intake of calcium and  vitamin D, diet and exercise, tobacco cessation    F/U  Return in about 1 year (around 06/23/2020).  Tomekia Helton B. Jalee Saine, PA-C 06/24/2019 2:03 PM

## 2019-06-24 NOTE — Patient Instructions (Signed)
I value your feedback and entrusting us with your care. If you get a [facility] patient survey, I would appreciate you taking the time to let us know about your experience today. Thank you!  As of March 11, 2019, your lab results will be released to your MyChart immediately, before I even have a chance to see them. Please give me time to review them and contact you if there are any abnormalities. Thank you for your patience.  

## 2019-06-26 ENCOUNTER — Other Ambulatory Visit: Payer: Self-pay | Admitting: Obstetrics and Gynecology

## 2019-06-26 DIAGNOSIS — E782 Mixed hyperlipidemia: Secondary | ICD-10-CM

## 2019-06-26 LAB — COMPREHENSIVE METABOLIC PANEL
ALT: 8 IU/L (ref 0–32)
AST: 14 IU/L (ref 0–40)
Albumin/Globulin Ratio: 2.8 — ABNORMAL HIGH (ref 1.2–2.2)
Albumin: 4.7 g/dL (ref 3.8–4.9)
Alkaline Phosphatase: 60 IU/L (ref 39–117)
BUN/Creatinine Ratio: 16 (ref 9–23)
BUN: 13 mg/dL (ref 6–24)
Bilirubin Total: 0.2 mg/dL (ref 0.0–1.2)
CO2: 25 mmol/L (ref 20–29)
Calcium: 9.6 mg/dL (ref 8.7–10.2)
Chloride: 98 mmol/L (ref 96–106)
Creatinine, Ser: 0.81 mg/dL (ref 0.57–1.00)
GFR calc Af Amer: 93 mL/min/{1.73_m2} (ref >59)
GFR calc non Af Amer: 80 mL/min/{1.73_m2} (ref >59)
Globulin, Total: 1.7 g/dL (ref 1.5–4.5)
Glucose: 88 mg/dL (ref 65–99)
Potassium: 4.4 mmol/L (ref 3.5–5.2)
Sodium: 135 mmol/L (ref 134–144)
Total Protein: 6.4 g/dL (ref 6.0–8.5)

## 2019-06-26 LAB — LIPID PANEL
Chol/HDL Ratio: 3.5 ratio (ref 0.0–4.4)
Cholesterol, Total: 201 mg/dL — ABNORMAL HIGH (ref 100–199)
HDL: 57 mg/dL (ref >39)
LDL Chol Calc (NIH): 132 mg/dL — ABNORMAL HIGH (ref 0–99)
Triglycerides: 64 mg/dL (ref 0–149)
VLDL Cholesterol Cal: 12 mg/dL (ref 5–40)

## 2020-05-07 ENCOUNTER — Other Ambulatory Visit: Payer: Self-pay

## 2020-05-07 ENCOUNTER — Ambulatory Visit
Admission: EM | Admit: 2020-05-07 | Discharge: 2020-05-07 | Disposition: A | Discharge status: Home / Self Care | Payer: Managed Care, Other (non HMO) | Attending: Physician Assistant | Admitting: Physician Assistant

## 2020-05-07 ENCOUNTER — Encounter: Payer: Self-pay | Admitting: Emergency Medicine

## 2020-05-07 DIAGNOSIS — B373 Candidiasis of vulva and vagina: Secondary | ICD-10-CM | POA: Diagnosis not present

## 2020-05-07 DIAGNOSIS — R1030 Lower abdominal pain, unspecified: Secondary | ICD-10-CM | POA: Insufficient documentation

## 2020-05-07 DIAGNOSIS — B3731 Acute candidiasis of vulva and vagina: Secondary | ICD-10-CM

## 2020-05-07 DIAGNOSIS — R300 Dysuria: Secondary | ICD-10-CM

## 2020-05-07 LAB — URINALYSIS, COMPLETE (UACMP) WITH MICROSCOPIC
Bacteria, UA: NONE SEEN
Bilirubin Urine: NEGATIVE
Glucose, UA: NEGATIVE mg/dL
Hgb urine dipstick: NEGATIVE
Ketones, ur: NEGATIVE mg/dL
Leukocytes,Ua: NEGATIVE
Nitrite: NEGATIVE
Protein, ur: NEGATIVE mg/dL
Specific Gravity, Urine: 1.015 (ref 1.005–1.030)
pH: 7 (ref 5.0–8.0)

## 2020-05-07 LAB — WET PREP, GENITAL
Clue Cells Wet Prep HPF POC: NONE SEEN
Sperm: NONE SEEN
Trich, Wet Prep: NONE SEEN

## 2020-05-07 MED ORDER — FLUCONAZOLE 150 MG PO TABS: ORAL_TABLET | ORAL | 0 refills | Status: DC

## 2020-05-07 NOTE — ED Provider Notes (Signed)
[facility]    CSN: [id number] Arrival date & time: 05/07/20  0800      History   Chief Complaint Chief Complaint  Patient presents with  . Abdominal Pain  . Dysuria    HPI Eileen Cunningham is a 60 y.o. female presenting for 2 day history of lower abdominal pressure, dysuria, and urinary frequency.  She denies any blood in the urine.  Denies any fever, fatigue, back pain.  Denies any associated nausea/vomiting or diarrhea.  Denies constipation.  Denies any vaginal discharge, itching or odor.  No abnormal vaginal bleeding.  She is postmenopausal.  Patient mitts to history of endometriosis in the past.  She denies any concern for STIs.  Patient says she is had UTIs in the past and this feels like a UTI since she has a frequency and urgency of urination.  She has taken over-the-counter Tylenol and Cystex with slight improvement in symptoms.  She has no other complaints or concerns.  HPI  Past Medical History:  Diagnosis Date  . Abnormal Pap smear of cervix .  Marland Kitchen Anemia   . Bacterial vaginosis   . BRCA negative 07/2013   MyRisk neg  . COPD (chronic obstructive pulmonary disease) (HCC)    mild, no meds  . Dysmenorrhea   . Endometriosis   . Family history of breast cancer 07/2013   MyRisk neg; IBIS=15%  . Family history of ovarian cancer    MyRisk neg 5/15  . GERD (gastroesophageal reflux disease)    occasional heartburn  . Hypercholesterolemia   . Menorrhagia   . Osteopenia   . Ovarian cyst     Patient Active Problem List   Diagnosis Date Noted  . Hyperlipidemia 08/19/2016    Past Surgical History:  Procedure Laterality Date  . BACK SURGERY     decompression,lower lumbar  . COLONOSCOPY N/A 10/19/2014   Procedure: COLONOSCOPY;  Surgeon: Hulen Luster, MD;  Location: [place];  Service: Gastroenterology;  Laterality: N/A;  . DIAGNOSTIC LAPAROSCOPY    . LAPAROSCOPIC ENDOMETRIOSIS FULGURATION      OB History    Gravida  2   Para  2   Term  2    Preterm      AB      Living  2     SAB      IAB      Ectopic      Multiple      Live Births  2            Home Medications    Prior to Admission medications   Medication Sig Start Date End Date Taking? Authorizing Provider  b complex vitamins capsule Take 1 capsule by mouth daily.   Yes [provider]  cholecalciferol (VITAMIN D) 400 UNITS TABS tablet Take 800 Units by mouth daily.   Yes [provider]  Coenzyme Q10 (COQ10) 100 MG CAPS Take by mouth daily.   Yes [provider]  fenofibrate 54 MG tablet TAKE 2 TABLETS (108 MG TOTAL) BY MOUTH DAILY. 08[birth year]01[birth year]52  Yes Copland, Deirdre Evener, PA-C  fluconazole (DIFLUCAN) 150 MG tablet Take 1 tablet by mouth today and repeat in 72 hours if still having symptoms 05/07/20  Yes Laurene Footman B, PA-C  Esomeprazole Magnesium (NEXIUM 24HR PO) Take by mouth.    [provider]  Resveratrol 250 MG CAPS Take 2 capsules by mouth daily. am    [provider]    Family History Family History  Problem Relation Age of Onset  . Lymphoma Brother 55       non hodgkins  . Coronary artery disease Brother   . Breast cancer Sister 48       recurrance at age 60  . Endometriosis Sister   . Lung cancer Brother 58  . Ovarian cancer Maternal Aunt   . Coronary artery disease Father     Social History Social History   Tobacco Use  . Smoking status: Current Every Day Smoker    Packs/day: 1.00    Years: 40.00    Pack years: 40.00    Types: Cigarettes  . Smokeless tobacco: Never Used  Vaping Use  . Vaping Use: Never used  Substance Use Topics  . Alcohol use: Yes    Comment: rarely wine  . Drug use: No     Allergies   Patient has no known allergies.   Review of Systems Review of Systems  Constitutional: Negative for chills and fever.  Gastrointestinal: Positive for abdominal pain (suprapubic pressure). Negative for diarrhea, nausea and vomiting.  Genitourinary: Positive for dysuria,  frequency and urgency. Negative for decreased urine volume, difficulty urinating, flank pain, hematuria, pelvic pain, vaginal bleeding, vaginal discharge and vaginal pain.  Musculoskeletal: Negative for back pain.  Skin: Negative for rash.     Physical Exam Triage Vital Signs ED Triage Vitals  Enc Vitals Group     BP 05/07/20 0815 128/73     Pulse Rate 05/07/20 0815 77     Resp 05/07/20 0815 14     Temp 05/07/20 0815 98.6 F (37 C)     Temp Source 05/07/20 0815 Oral     SpO2 05/07/20 0815 100 %     Weight 05/07/20 0812 170 lb (77.1 kg)     Height 05/07/20 0812 $RemoveBefor'5\' 6"'PwrBxCFwXxzR$  (1.676 m)     Head Circumference --      Peak Flow --      Pain Score 05/07/20 0811 7     Pain Loc --      Pain Edu? --      Excl. in [place]? --    No data found.  Updated Vital Signs BP 128/73 (BP Location: Left Arm)   Pulse 77   Temp 98.6 F (37 C) (Oral)   Resp 14   Ht $R'5\' 6"'su$  (1.676 m)   Wt 170 lb (77.1 kg)   SpO2 100%   BMI 27.44 kg/m      Physical Exam Vitals and nursing note reviewed.  Constitutional:      General: She is not in acute distress.    Appearance: Normal appearance. She is not ill-appearing or toxic-appearing.  HENT:     Head: Normocephalic and atraumatic.  Eyes:     General: No scleral icterus.       Right eye: No discharge.        Left eye: No discharge.     Conjunctiva/sclera: Conjunctivae normal.  Cardiovascular:     Rate and Rhythm: Normal rate and regular rhythm.     Heart sounds: Normal heart sounds.  Pulmonary:     Effort: Pulmonary effort is normal. No respiratory distress.     Breath sounds: Normal breath sounds.  Abdominal:     Palpations: Abdomen is soft.     Tenderness: There is abdominal tenderness in the suprapubic area. There is no right CVA tenderness, left CVA tenderness or guarding.  Musculoskeletal:     Cervical back: Neck supple.  Skin:    General: Skin  is dry.  Neurological:     General: No focal deficit present.     Mental Status: She is alert. Mental  status is at baseline.     Motor: No weakness.     Gait: Gait normal.  Psychiatric:        Mood and Affect: Mood normal.        Behavior: Behavior normal.        Thought Content: Thought content normal.      UC Treatments / Results  Labs (all labs ordered are listed, but only abnormal results are displayed) Labs Reviewed  WET PREP, GENITAL - Abnormal; Notable for the following components:      Result Value   Yeast Wet Prep HPF POC PRESENT (*)    WBC, Wet Prep HPF POC FEW (*)    All other components within normal limits  URINE CULTURE  URINALYSIS, COMPLETE (UACMP) WITH MICROSCOPIC    EKG   Radiology No results found.  Procedures Procedures (including critical care time)  Medications Ordered in UC Medications - No data to display  Initial Impression / Assessment and Plan / UC Course  I have reviewed the triage vital signs and the nursing notes.  Pertinent labs & imaging results that were available during my care of the patient were reviewed by me and considered in my medical decision making (see chart for details).   60 year old female presenting for 2-day history of lower abdominal pain and pressure, pain with urination and urinary frequency.  Clinical vital signs are normal and stable.  Exam is only significant for some suprapubic tenderness to palpation.  Urinalysis is within normal limits.  We will send urine for culture, but does not appear to be UTI based on the urinalysis.  Advised her to increase rest and fluids.  Advised we will call if we need to start her on antibiotics.  Wet prep was positive for yeast.  Treating yeast infection with Diflucan.  Discussed supportive care as well with patient.  Advised to follow-up with [place] as needed.  ED precautions for abdominal pain discussed with patient.   Final Clinical Impressions(s) / UC Diagnoses   Final diagnoses:  Vaginal yeast infection  Lower abdominal pain  Dysuria     Discharge Instructions     Your  urinalysis does not seem to support a urinary tract infection.  I will send the urine for culture and if bacteria grows, we will call you and start you on an antibiotic.  I would advise you continue to increase rest and fluids.  The vaginal swab we performed does show that you have a yeast infection.  I have sent Diflucan to the pharmacy for that.  That could definitely cause you to have some suprapubic/pelvic pressure.  It should get better over the next 3 days after starting the medication.  Make sure to keep your blood sugars under control to avoid yeast infections.  I do not advise using any scented body washes.  Try to use pH balance washes and feminine wipes.  Follow-up with [place] as needed for any worsening symptoms.  ABDOMINAL PAIN: You may take Tylenol for pain relief. Use medications as directed including antiemetics and antidiarrheal medications if suggested or prescribed. You should increase fluids and electrolytes as well as rest over these next several days. If you have any questions or concerns, or if your symptoms are not improving or if especially if they acutely worsen, please call or stop back to the clinic immediately and we will  be happy to help you or go to the ER   ABDOMINAL PAIN RED FLAGS: Seek immediate further care if: symptoms remain the same or worsen over the next 3-7 days, you are unable to keep fluids down, you see blood or mucus in your stool, you vomit black or dark red material, you have a fever of 101.F or higher, you have localized and/or persistent abdominal pain      ED Prescriptions    Medication Sig Dispense Auth. Provider   fluconazole (DIFLUCAN) 150 MG tablet Take 1 tablet by mouth today and repeat in 72 hours if still having symptoms 2 tablet Danton Clap, PA-C     PDMP not reviewed this encounter.   Danton Clap, PA-C 05/07/20 [id number]

## 2020-05-07 NOTE — Discharge Instructions (Addendum)
Your urinalysis does not seem to support a urinary tract infection.  I will send the urine for culture and if bacteria grows, we will call you and start you on an antibiotic.  I would advise you continue to increase rest and fluids.  The vaginal swab we performed does show that you have a yeast infection.  I have sent Diflucan to the pharmacy for that.  That could definitely cause you to have some suprapubic/pelvic pressure.  It should get better over the next 3 days after starting the medication.    I do not advise using any scented body washes.  Try to use pH balance washes and feminine wipes.  Follow-up with [place] as needed for any worsening symptoms.  ABDOMINAL PAIN: You may take Tylenol for pain relief. Use medications as directed including antiemetics and antidiarrheal medications if suggested or prescribed. You should increase fluids and electrolytes as well as rest over these next several days. If you have any questions or concerns, or if your symptoms are not improving or if especially if they acutely worsen, please call or stop back to the clinic immediately and we will be happy to help you or go to the ER   ABDOMINAL PAIN RED FLAGS: Seek immediate further care if: symptoms remain the same or worsen over the next 3-7 days, you are unable to keep fluids down, you see blood or mucus in your stool, you vomit black or dark red material, you have a fever of 101.F or higher, you have localized and/or persistent abdominal pain

## 2020-05-07 NOTE — ED Triage Notes (Signed)
Patient c/o lower abdominal pain and pressure, dysuria, and urinary frequency that started on Friday.

## 2020-05-08 LAB — URINE CULTURE: Culture: NO GROWTH

## 2020-05-17 ENCOUNTER — Ambulatory Visit
Admission: RE | Admit: 2020-05-17 | Discharge: 2020-05-17 | Disposition: A | Discharge status: Home / Self Care | Payer: Managed Care, Other (non HMO) | Source: Ambulatory Visit | Attending: Emergency Medicine | Admitting: Emergency Medicine

## 2020-05-17 ENCOUNTER — Other Ambulatory Visit: Payer: Self-pay

## 2020-05-17 VITALS — BP 115/73 | HR 76 | Temp 98.4°F | Resp 18 | Ht 66.0 in | Wt 170.0 lb

## 2020-05-17 DIAGNOSIS — Z113 Encounter for screening for infections with a predominantly sexual mode of transmission: Secondary | ICD-10-CM | POA: Diagnosis not present

## 2020-05-17 DIAGNOSIS — N898 Other specified noninflammatory disorders of vagina: Secondary | ICD-10-CM | POA: Diagnosis not present

## 2020-05-17 DIAGNOSIS — R1030 Lower abdominal pain, unspecified: Secondary | ICD-10-CM | POA: Diagnosis not present

## 2020-05-17 LAB — URINALYSIS, COMPLETE (UACMP) WITH MICROSCOPIC
Bilirubin Urine: NEGATIVE
Glucose, UA: NEGATIVE mg/dL
Hgb urine dipstick: NEGATIVE
Ketones, ur: NEGATIVE mg/dL
Leukocytes,Ua: NEGATIVE
Nitrite: NEGATIVE
Protein, ur: NEGATIVE mg/dL
RBC / HPF: NONE SEEN RBC/hpf (ref 0–5)
Specific Gravity, Urine: 1.015 (ref 1.005–1.030)
pH: 7 (ref 5.0–8.0)

## 2020-05-17 LAB — WET PREP, GENITAL
Clue Cells Wet Prep HPF POC: NONE SEEN
Sperm: NONE SEEN
Trich, Wet Prep: NONE SEEN
Yeast Wet Prep HPF POC: NONE SEEN

## 2020-05-17 MED ORDER — FLUCONAZOLE 150 MG PO TABS: 150.0000 mg | ORAL_TABLET | Freq: Every day | ORAL | 0 refills | Status: DC

## 2020-05-17 MED ORDER — METRONIDAZOLE 500 MG PO TABS: 500.0000 mg | ORAL_TABLET | Freq: Two times a day (BID) | ORAL | 0 refills | Status: DC

## 2020-05-17 MED ORDER — DOXYCYCLINE HYCLATE 100 MG PO CAPS: 100.0000 mg | ORAL_CAPSULE | Freq: Two times a day (BID) | ORAL | 0 refills | Status: DC

## 2020-05-17 NOTE — ED Triage Notes (Signed)
Patient states that she was seen here for a yeast infection 10 days ago and took all the diflucan that was prescribed. Patient states that she has continued to have painful urination, vaginal discharge and lower abdominal pressure.

## 2020-05-17 NOTE — ED Provider Notes (Signed)
[facility]    CSN: [id number] Arrival date & time: 05/17/20  1556      History   Chief Complaint Chief Complaint  Patient presents with  . Vaginal Discharge    HPI Eileen Cunningham is a 60 y.o. female who has been having unresolved suprapubic pain. She no longer has itcvhing. Used a vaginal gel the past 2 days thinking the yeast infection was not gone. Has not had sex in 3-4 years. Her pap is up to date. Sees her GYN next month. Has hx of endometriosis. Has been having a yellow discharge since she used a natural yeast vaginal get 2 days ago and last night. Denies dysuria.     Past Medical History:  Diagnosis Date  . Abnormal Pap smear of cervix .  Marland Kitchen Anemia   . Bacterial vaginosis   . BRCA negative 07/2013   MyRisk neg  . COPD (chronic obstructive pulmonary disease) (HCC)    mild, no meds  . Dysmenorrhea   . Endometriosis   . Family history of breast cancer 07/2013   MyRisk neg; IBIS=15%  . Family history of ovarian cancer    MyRisk neg 5/15  . GERD (gastroesophageal reflux disease)    occasional heartburn  . Hypercholesterolemia   . Menorrhagia   . Osteopenia   . Ovarian cyst     Patient Active Problem List   Diagnosis Date Noted  . Hyperlipidemia 08/19/2016    Past Surgical History:  Procedure Laterality Date  . BACK SURGERY     decompression,lower lumbar  . COLONOSCOPY N/A 10/19/2014   Procedure: COLONOSCOPY;  Surgeon: Hulen Luster, MD;  Location: [facility];  Service: Gastroenterology;  Laterality: N/A;  . DIAGNOSTIC LAPAROSCOPY    . LAPAROSCOPIC ENDOMETRIOSIS FULGURATION      OB History    Gravida  2   Para  2   Term  2   Preterm      AB      Living  2     SAB      IAB      Ectopic      Multiple      Live Births  2            Home Medications    Prior to Admission medications   Medication Sig Start Date End Date Taking? Authorizing Provider  b complex vitamins capsule Take 1 capsule by mouth daily.    Yes [provider]  cholecalciferol (VITAMIN D) 400 UNITS TABS tablet Take 800 Units by mouth daily.   Yes [provider]  Coenzyme Q10 (COQ10) 100 MG CAPS Take by mouth daily.   Yes [provider]  doxycycline (VIBRAMYCIN) 100 MG capsule Take 1 capsule (100 mg total) by mouth 2 (two) times daily. 05/17/20  Yes Rodriguez-Southworth, Sunday Spillers, PA-C  Esomeprazole Magnesium (NEXIUM 24HR PO) Take by mouth.   Yes [provider]  fenofibrate 54 MG tablet TAKE 2 TABLETS (108 MG TOTAL) BY MOUTH DAILY. 3/76/28  Yes Copland, Deirdre Evener, PA-C  fluconazole (DIFLUCAN) 150 MG tablet Take 1 tablet (150 mg total) by mouth daily. 05/17/20  Yes Rodriguez-Southworth, Sunday Spillers, PA-C  metroNIDAZOLE (FLAGYL) 500 MG tablet Take 1 tablet (500 mg total) by mouth 2 (two) times daily. 05/17/20  Yes Rodriguez-Southworth, Sunday Spillers, PA-C  Resveratrol 250 MG CAPS Take 2 capsules by mouth daily. am   Yes [provider]    Family History Family History  Problem Relation Age of Onset  . Lymphoma  Brother 55       non hodgkins  . Coronary artery disease Brother   . Breast cancer Sister 67       recurrance at age 55  . Endometriosis Sister   . Lung cancer Brother 81  . Ovarian cancer Maternal Aunt   . Coronary artery disease Father     Social History Social History   Tobacco Use  . Smoking status: Current Every Day Smoker    Packs/day: 1.00    Years: 40.00    Pack years: 40.00    Types: Cigarettes  . Smokeless tobacco: Never Used  Vaping Use  . Vaping Use: Never used  Substance Use Topics  . Alcohol use: Yes    Comment: rarely wine  . Drug use: No     Allergies   Patient has no known allergies.   Review of Systems Review of Systems  Constitutional: Negative for chills, diaphoresis and fever.  Gastrointestinal: Positive for abdominal pain. Negative for diarrhea, nausea and vomiting.  Genitourinary: Positive for vaginal discharge. Negative for dysuria, flank pain,  frequency, genital sores, urgency and vaginal bleeding.  Skin: Negative for rash.  Hematological: Negative for adenopathy.     Physical Exam Triage Vital Signs ED Triage Vitals  Enc Vitals Group     BP 05/17/20 1623 115/73     Pulse Rate 05/17/20 1623 76     Resp 05/17/20 1623 18     Temp 05/17/20 1623 98.4 F (36.9 C)     Temp Source 05/17/20 1623 Oral     SpO2 05/17/20 1623 98 %     Weight 05/17/20 1621 169 lb 15.6 oz (77.1 kg)     Height 05/17/20 1621 $RemoveBefor'5\' 6"'oitoxYicXcCP$  (1.676 m)     Head Circumference --      Peak Flow --      Pain Score 05/17/20 1620 8     Pain Loc --      Pain Edu? --      Excl. in [place]? --    No data found.  Updated Vital Signs BP 115/73 (BP Location: Left Arm)   Pulse 76   Temp 98.4 F (36.9 C) (Oral)   Resp 18   Ht $R'5\' 6"'Pt$  (1.676 m)   Wt 169 lb 15.6 oz (77.1 kg)   SpO2 98%   BMI 27.43 kg/m   Visual Acuity Right Eye Distance:   Left Eye Distance:   Bilateral Distance:    Right Eye Near:   Left Eye Near:    Bilateral Near:     Physical Exam Vitals and nursing note reviewed.  Constitutional:      General: She is not in acute distress.    Appearance: She is not toxic-appearing.  HENT:     Right Ear: External ear normal.     Left Ear: External ear normal.  Pulmonary:     Effort: Pulmonary effort is normal.  Abdominal:     General: Bowel sounds are normal.     Palpations: Abdomen is soft.     Tenderness: There is no guarding or rebound.     Comments: Has tenderness over suprapubic region  Genitourinary:    General: Normal vulva.     Vagina: Vaginal discharge present.     Comments: Cervical motion provoked pelvic pain similar to what she has been experiencing. Discharge of light yellow color noted around cervix. Cervix is pink.  Musculoskeletal:        General: Normal range of motion.  Cervical back: Neck supple.  Neurological:     Mental Status: She is alert and oriented to person, place, and time.     Gait: Gait normal.  Psychiatric:         Mood and Affect: Mood normal.        Behavior: Behavior normal.        Thought Content: Thought content normal.        Judgment: Judgment normal.    UC Treatments / Results  Labs (all labs ordered are listed, but only abnormal results are displayed) Labs Reviewed  WET PREP, GENITAL - Abnormal; Notable for the following components:      Result Value   WBC, Wet Prep HPF POC MODERATE (*)    All other components within normal limits  URINALYSIS, COMPLETE (UACMP) WITH MICROSCOPIC - Abnormal; Notable for the following components:   Bacteria, UA FEW (*)    All other components within normal limits    EKG   Radiology No results found.  Procedures Procedures (including critical care time)  Medications Ordered in UC Medications - No data to display  Initial Impression / Assessment and Plan / UC Course  I have reviewed the triage vital signs and the nursing notes. Pertinent labs results that were available during my care of the patient were reviewed by me and considered in my medical decision making (see chart for details). Pt declined STD testing. I placed her on Doxy and Flagyl for possible uterine infecting. She declined Rocephin injection. Needs to Fu with her GYN and may need pelvic US if pelvic pain persist.s   Final Clinical Impressions(s) / UC Diagnoses   Final diagnoses:  Lower abdominal pain  Vaginal discharge  Screening for STD (sexually transmitted disease)     Discharge Instructions     Follow up with your gynecologist in the next week to 10 days if pelvic pain is still present     ED Prescriptions    Medication Sig Dispense Auth. Provider   doxycycline (VIBRAMYCIN) 100 MG capsule Take 1 capsule (100 mg total) by mouth 2 (two) times daily. 20 capsule Rodriguez-Southworth, Sunday Spillers, PA-C   metroNIDAZOLE (FLAGYL) 500 MG tablet Take 1 tablet (500 mg total) by mouth 2 (two) times daily. 14 tablet Rodriguez-Southworth, Naasir Carreira, PA-C   fluconazole (DIFLUCAN) 150  MG tablet Take 1 tablet (150 mg total) by mouth daily. 2 tablet Rodriguez-Southworth, Sunday Spillers, PA-C     PDMP not reviewed this encounter.   Shelby Mattocks, Hershal Coria 05/17/20 2028

## 2020-05-17 NOTE — Discharge Instructions (Signed)
Follow up with your gynecologist in the next week to 10 days if pelvic pain is still present

## 2020-05-23 ENCOUNTER — Other Ambulatory Visit: Payer: Self-pay | Admitting: Obstetrics and Gynecology

## 2020-05-23 DIAGNOSIS — Z1231 Encounter for screening mammogram for malignant neoplasm of breast: Secondary | ICD-10-CM

## 2020-06-13 ENCOUNTER — Other Ambulatory Visit: Payer: Self-pay

## 2020-06-13 ENCOUNTER — Ambulatory Visit
Admission: RE | Admit: 2020-06-13 | Discharge: 2020-06-13 | Disposition: A | Discharge status: Home / Self Care | Payer: Managed Care, Other (non HMO) | Source: Ambulatory Visit | Attending: Obstetrics and Gynecology | Admitting: Obstetrics and Gynecology

## 2020-06-13 DIAGNOSIS — Z1231 Encounter for screening mammogram for malignant neoplasm of breast: Secondary | ICD-10-CM | POA: Insufficient documentation

## 2020-06-14 ENCOUNTER — Ambulatory Visit (INDEPENDENT_AMBULATORY_CARE_PROVIDER_SITE_OTHER): Payer: Managed Care, Other (non HMO) | Admitting: Obstetrics and Gynecology

## 2020-06-14 ENCOUNTER — Encounter: Payer: Self-pay | Admitting: Obstetrics and Gynecology

## 2020-06-14 ENCOUNTER — Other Ambulatory Visit: Payer: Self-pay | Admitting: Obstetrics and Gynecology

## 2020-06-14 ENCOUNTER — Other Ambulatory Visit (INDEPENDENT_AMBULATORY_CARE_PROVIDER_SITE_OTHER): Payer: Managed Care, Other (non HMO)

## 2020-06-14 VITALS — BP 112/70 | Ht 66.0 in | Wt 178.0 lb

## 2020-06-14 DIAGNOSIS — R1031 Right lower quadrant pain: Secondary | ICD-10-CM | POA: Diagnosis not present

## 2020-06-14 DIAGNOSIS — Z0000 Encounter for general adult medical examination without abnormal findings: Secondary | ICD-10-CM | POA: Diagnosis not present

## 2020-06-14 DIAGNOSIS — E782 Mixed hyperlipidemia: Secondary | ICD-10-CM

## 2020-06-14 DIAGNOSIS — R102 Pelvic and perineal pain: Secondary | ICD-10-CM

## 2020-06-14 LAB — POCT URINALYSIS DIPSTICK
Bilirubin, UA: NEGATIVE
Blood, UA: NEGATIVE
Glucose, UA: NEGATIVE
Ketones, UA: NEGATIVE
Leukocytes, UA: NEGATIVE
Nitrite, UA: NEGATIVE
Protein, UA: NEGATIVE
Spec Grav, UA: 1.01 (ref 1.010–1.025)
pH, UA: 6.5 (ref 5.0–8.0)

## 2020-06-14 NOTE — Patient Instructions (Signed)
I value your feedback and you entrusting us with your care. If you get a [facility] patient survey, I would appreciate you taking the time to let us know about your experience today. Thank you! ? ? ?

## 2020-06-14 NOTE — Progress Notes (Signed)
System, Provider Not In   Chief Complaint  Patient presents with  . Pelvic Pain    Mainly right side, burning when urinating on/off since feb 4    HPI:      [name] is a 60 y.o. [id number] whose LMP was No LMP recorded. Patient is postmenopausal., presents today for pelvic pain/RLQ pain since yesterday. This is also f/u from Urgent care visit 2/22.   Pelvic pain, central and towards RLQ, started about a month ago. Pain is sharp, constant and sometimes stabbing; sx increase when has to void or have BM. Pt thought was due to UTI and took cystex and tylenol without relief. No vag, urin or GI sx (has BM QD to QOD, no changes), no LBP, no fevers. Went to Urgent care. Had neg UA, pos wet prep and treated for yeast vag with diflucan with neg C&S 05/07/20. Sx persisted so went back to Urgent care again 05/17/20. Noted to have d/c and possible CMT, treated with flagyl and doxy. Sx improved until they returned yesterday.  Pain is now more RLQ and caused pt to "double over" last night. Sx increase with urination and BM. Still no other urin sx, no vag/GI sx, no fevers, no LBP. Has been using pain patches and heating pad with some improvement today.  Not sex active in several yrs.  S/p menopause, hx endometriosis. No PMB. Last pap neg 2019. Neg colonoscopy 2016. FH ovarian and now gallbladder cancer; pt is MyRisk full gene panel neg.  Annual due after 06/23/20.    Past Medical History:  Diagnosis Date  . Abnormal Pap smear of cervix .  Marland Kitchen Anemia   . Bacterial vaginosis   . BRCA negative 07/2013   MyRisk neg  . COPD (chronic obstructive pulmonary disease) (HCC)    mild, no meds  . Dysmenorrhea   . Endometriosis   . Family history of breast cancer 07/2013   MyRisk neg; IBIS=15%  . Family history of ovarian cancer    MyRisk neg 5/15  . GERD (gastroesophageal reflux disease)    occasional heartburn  . Hypercholesterolemia   . Menorrhagia   . Osteopenia   . Ovarian cyst     Past  Surgical History:  Procedure Laterality Date  . BACK SURGERY     decompression,lower lumbar  . COLONOSCOPY N/A 10/19/2014   Procedure: COLONOSCOPY;  Surgeon: Hulen Luster, MD;  Location: [facility];  Service: Gastroenterology;  Laterality: N/A;  . DIAGNOSTIC LAPAROSCOPY    . LAPAROSCOPIC ENDOMETRIOSIS FULGURATION      Family History  Problem Relation Age of Onset  . Lymphoma Brother 55       non hodgkins  . Coronary artery disease Brother   . Breast cancer Sister 71       recurrance at age 42  . Endometriosis Sister   . Lung cancer Brother 61  . Ovarian cancer Maternal Aunt   . Coronary artery disease Father   . Cancer Brother        gallbladder    Social History   Socioeconomic History  . Marital status: Married    Spouse name: Not on file  . Number of children: Not on file  . Years of education: Not on file  . Highest education level: Not on file  Occupational History  . Not on file  Tobacco Use  . Smoking status: Current Every Day Smoker    Packs/day: 1.00    Years: 40.00    Pack  years: 40.00    Types: Cigarettes  . Smokeless tobacco: Never Used  Vaping Use  . Vaping Use: Never used  Substance and Sexual Activity  . Alcohol use: Yes    Comment: rarely wine  . Drug use: No  . Sexual activity: Not Currently    Birth control/protection: Post-menopausal  Other Topics Concern  . Not on file  Social History Narrative  . Not on file   Social Determinants of Health   Financial Resource Strain: Not on file  Food Insecurity: Not on file  Transportation Needs: Not on file  Physical Activity: Not on file  Stress: Not on file  Social Connections: Not on file  Intimate Partner Violence: Not on file    Outpatient Medications Prior to Visit  Medication Sig Dispense Refill  . b complex vitamins capsule Take 1 capsule by mouth daily.    . cholecalciferol (VITAMIN D) 400 UNITS TABS tablet Take 800 Units by mouth daily.    . Coenzyme Q10 (COQ10) 100 MG CAPS  Take by mouth daily.    Marland Kitchen doxycycline (VIBRAMYCIN) 100 MG capsule Take 1 capsule (100 mg total) by mouth 2 (two) times daily. 20 capsule 0  . Esomeprazole Magnesium (NEXIUM 24HR PO) Take by mouth.    . fenofibrate 54 MG tablet TAKE 2 TABLETS (108 MG TOTAL) BY MOUTH DAILY. 180 tablet 3  . Resveratrol 250 MG CAPS Take 2 capsules by mouth daily. am    . fluconazole (DIFLUCAN) 150 MG tablet Take 1 tablet (150 mg total) by mouth daily. 2 tablet 0  . metroNIDAZOLE (FLAGYL) 500 MG tablet Take 1 tablet (500 mg total) by mouth 2 (two) times daily. 14 tablet 0   No facility-administered medications prior to visit.      ROS:  Review of Systems  Constitutional: Negative for fever.  Gastrointestinal: Negative for blood in stool, constipation, diarrhea, nausea and vomiting.  Genitourinary: Positive for pelvic pain. Negative for dyspareunia, dysuria, flank pain, frequency, hematuria, urgency, vaginal bleeding, vaginal discharge and vaginal pain.  Musculoskeletal: Negative for back pain.  Skin: Negative for rash.    OBJECTIVE:   Vitals:  BP 112/70   Ht 5' 6" (1.676 m)   Wt 178 lb (80.7 kg)   BMI 28.73 kg/m   Physical Exam Vitals reviewed.  Constitutional:      Appearance: She is well-developed.  Pulmonary:     Effort: Pulmonary effort is normal.  Abdominal:     Palpations: Abdomen is soft.     Tenderness: There is abdominal tenderness in the right lower quadrant and suprapubic area. There is no guarding or rebound.  Genitourinary:    General: Normal vulva.     Pubic Area: No rash.      Labia:        Right: No rash, tenderness or lesion.        Left: No rash, tenderness or lesion.      Vagina: Normal. No vaginal discharge, erythema or tenderness.     Cervix: Normal.     Uterus: Normal. Tender. Not enlarged.      Adnexa: Left adnexa normal.       Right: Tenderness present. No mass.         Left: No mass or tenderness.    Musculoskeletal:        General: Normal range of motion.      Cervical back: Normal range of motion.  Skin:    General: Skin is warm and dry.  Neurological:  General: No focal deficit present.     Mental Status: She is alert and oriented to person, place, and time.  Psychiatric:        Mood and Affect: Mood normal.        Behavior: Behavior normal.        Thought Content: Thought content normal.        Judgment: Judgment normal.     ULTRASOUND REPORT  Location: [facility]  Date of Service: 06/14/2020    Indications:Pelvic Pain Findings:  The uterus is anteverted and measures 6.5 x 4.2 x 2.4. Echo texture is homogenous without evidence of focal masses. Within the uterus are multiple suspected fibroids measuring:  The Endometrium measures 2.3 mm with trace fluid.   Right Ovary measures 2.0 x 1.2 x 1.2 cm. It is normal in appearance. Left Ovary measures 1.8 x 1.1 x 1.1 cm. It is normal in appearance. Survey of the adnexa demonstrates no adnexal masses. There is no free fluid in the cul de sac.  Impression: 1. Normal pelvic ultrasound  Recommendations: 1.Clinical correlation with the patient's History and Physical Exam.   Vita Barley, RT  Assessment/Plan: Pelvic pain - Plan: US PELVIS TRANSVAGINAL NON-OB (TV ONLY), POCT Urinalysis Dipstick, Tender on exam, no CMT. Neg UA, neg GYN u/s. Question etiology--could be bladder vs appendix. Pt to go to ED if sx get severe again for further eval. If are stable but persist, will check CT. Pt to f/u with sx. Cont pain patches and heating pad for now.   RLQ abdominal pain  Blood tests for routine general physical examination - Plan: Comprehensive metabolic panel, Lipid panel; labs due before annual in a few wks. Orders placed per pt request.   Mixed hyperlipidemia - Plan: Comprehensive metabolic panel, Lipid panel    Return if symptoms worsen or fail to improve.  Elvenia Godden B. Terrianne Cavness, PA-C 06/14/2020 3:23 PM

## 2020-06-17 LAB — COMPREHENSIVE METABOLIC PANEL
ALT: 12 IU/L (ref 0–32)
AST: 15 IU/L (ref 0–40)
Albumin/Globulin Ratio: 1.8 (ref 1.2–2.2)
Albumin: 4.2 g/dL (ref 3.8–4.9)
Alkaline Phosphatase: 69 IU/L (ref 44–121)
BUN/Creatinine Ratio: 18 (ref 9–23)
BUN: 12 mg/dL (ref 6–24)
Bilirubin Total: 0.2 mg/dL (ref 0.0–1.2)
CO2: 21 mmol/L (ref 20–29)
Calcium: 9.2 mg/dL (ref 8.7–10.2)
Chloride: 98 mmol/L (ref 96–106)
Creatinine, Ser: 0.67 mg/dL (ref 0.57–1.00)
Globulin, Total: 2.3 g/dL (ref 1.5–4.5)
Glucose: 81 mg/dL (ref 65–99)
Potassium: 4.2 mmol/L (ref 3.5–5.2)
Sodium: 136 mmol/L (ref 134–144)
Total Protein: 6.5 g/dL (ref 6.0–8.5)
eGFR: 101 mL/min/{1.73_m2} (ref >59)

## 2020-06-17 LAB — LIPID PANEL
Chol/HDL Ratio: 2.9 ratio (ref 0.0–4.4)
Cholesterol, Total: 184 mg/dL (ref 100–199)
HDL: 63 mg/dL (ref >39)
LDL Chol Calc (NIH): 112 mg/dL — ABNORMAL HIGH (ref 0–99)
Triglycerides: 46 mg/dL (ref 0–149)
VLDL Cholesterol Cal: 9 mg/dL (ref 5–40)

## 2020-06-18 ENCOUNTER — Other Ambulatory Visit: Payer: Self-pay | Admitting: Obstetrics and Gynecology

## 2020-06-18 DIAGNOSIS — E782 Mixed hyperlipidemia: Secondary | ICD-10-CM

## 2020-06-18 MED ORDER — FENOFIBRATE 54 MG PO TABS: 108.0000 mg | ORAL_TABLET | Freq: Every day | ORAL | 3 refills | Status: DC

## 2020-06-18 NOTE — Progress Notes (Signed)
Pls call pt to see how her pelvic pain from last wk is doing. Thx

## 2020-06-18 NOTE — Progress Notes (Signed)
Rx RF fenofibrate due to normal lipids with current Rx

## 2020-06-22 NOTE — Progress Notes (Signed)
PCP: System, Provider Not In   Chief Complaint  Patient presents with  . Gynecologic Exam    No concerns    HPI:      [name] is a 60 y.o. No obstetric history on file. who LMP was No LMP recorded. Patient is postmenopausal., presents today for her annual examination.  Her menses are absent due to menopause. She does not have PMB. She does have occas vasomotor sx. Pelvic pain from 06/14/20 resolved.  Sex activity: not sexually active. She does not have vaginal dryness.  Last Pap: 04/08/17  Results were: no abnormalities /neg HPV DNA.  Hx of STDs: none  Last mammogram: 06/13/20  Results were: normal--routine follow-up in 12 months There is a FH of breast cancer in her sister. Pt is MyRisk neg 2015, IBIS=15%. There is a FH of ovarian cancer in her mat aunt. The patient does self-breast exams.  Colonoscopy: colonoscopy 2016 without abnormalities.  Repeat due after 10 years.   Tobacco use: 3/4 ppd for many yrs. Not ready to quit. Had stable CXR 2/20 Alcohol use: social drinker  No drug use Exercise: very active  She does get adequate calcium and Vitamin D in her diet.  She has hyperlipidemia and takes fenofibrate 2-54 mg tabs daily (had to change Rx due to insurance 1/20). She can't tolerate statins. Had normal labs 3/22. Rx RF already sent. Has done well with wt loss.  Past Medical History:  Diagnosis Date  . Abnormal Pap smear of cervix .  Marland Kitchen Anemia   . Bacterial vaginosis   . BRCA negative 07/2013   MyRisk neg  . COPD (chronic obstructive pulmonary disease) (HCC)    mild, no meds  . Dysmenorrhea   . Endometriosis   . Family history of breast cancer 07/2013   MyRisk neg; IBIS=15%  . Family history of ovarian cancer    MyRisk neg 5/15  . GERD (gastroesophageal reflux disease)    occasional heartburn  . Hypercholesterolemia   . Menorrhagia   . Osteopenia   . Ovarian cyst     Past Surgical History:  Procedure Laterality Date  . BACK SURGERY      decompression,lower lumbar  . COLONOSCOPY N/A 10/19/2014   Procedure: COLONOSCOPY;  Surgeon: Hulen Luster, MD;  Location: [facility];  Service: Gastroenterology;  Laterality: N/A;  . DIAGNOSTIC LAPAROSCOPY    . LAPAROSCOPIC ENDOMETRIOSIS FULGURATION      Family History  Problem Relation Age of Onset  . Lymphoma Brother 55       non hodgkins  . Coronary artery disease Brother   . Breast cancer Sister 99       recurrance at age 18  . Endometriosis Sister   . Lung cancer Brother 25  . Ovarian cancer Maternal Aunt   . Coronary artery disease Father   . Cancer Brother        gallbladder    Social History   Socioeconomic History  . Marital status: Married    Spouse name: Not on file  . Number of children: Not on file  . Years of education: Not on file  . Highest education level: Not on file  Occupational History  . Not on file  Tobacco Use  . Smoking status: Current Every Day Smoker    Packs/day: 1.00    Years: 40.00    Pack years: 40.00    Types: Cigarettes  . Smokeless tobacco: Never Used  Vaping Use  . Vaping Use: Never used  Substance and Sexual Activity  . Alcohol use: Yes    Comment: rarely wine  . Drug use: No  . Sexual activity: Not Currently    Birth control/protection: Post-menopausal  Other Topics Concern  . Not on file  Social History Narrative  . Not on file   Social Determinants of Health   Financial Resource Strain: Not on file  Food Insecurity: Not on file  Transportation Needs: Not on file  Physical Activity: Not on file  Stress: Not on file  Social Connections: Not on file  Intimate Partner Violence: Not on file    Current Meds  Medication Sig  . b complex vitamins capsule Take 1 capsule by mouth daily.  . cholecalciferol (VITAMIN D) 400 UNITS TABS tablet Take 800 Units by mouth daily.  . Coenzyme Q10 (COQ10) 100 MG CAPS Take by mouth daily.  Marland Kitchen doxycycline (VIBRAMYCIN) 100 MG capsule Take 1 capsule (100 mg total) by mouth 2 (two)  times daily.  . Esomeprazole Magnesium (NEXIUM 24HR PO) Take by mouth.  . fenofibrate 54 MG tablet Take 2 tablets (108 mg total) by mouth daily.  Marland Kitchen Resveratrol 250 MG CAPS Take 2 capsules by mouth daily. am      ROS:  Review of Systems  Constitutional: Negative for fatigue, fever and unexpected weight change.  Respiratory: Negative for cough, shortness of breath and wheezing.   Cardiovascular: Negative for chest pain, palpitations and leg swelling.  Gastrointestinal: Negative for blood in stool, constipation, diarrhea, nausea and vomiting.  Endocrine: Negative for cold intolerance, heat intolerance and polyuria.  Genitourinary: Negative for dyspareunia, dysuria, flank pain, frequency, genital sores, hematuria, menstrual problem, pelvic pain, urgency, vaginal bleeding, vaginal discharge and vaginal pain.  Musculoskeletal: Negative for back pain, joint swelling and myalgias.  Skin: Negative for rash.  Neurological: Negative for dizziness, syncope, light-headedness, numbness and headaches.  Hematological: Negative for adenopathy.  Psychiatric/Behavioral: Negative for agitation, confusion, sleep disturbance and suicidal ideas. The patient is not nervous/anxious.      Objective: BP 120/70   Ht _0  (1.676 m)   Wt 180 lb (81.6 kg)   BMI 29.05 kg/m    Physical Exam Constitutional:      Appearance: She is well-developed.  Genitourinary:     Vulva normal.     Right Labia: No rash, tenderness or lesions.    Left Labia: No tenderness, lesions or rash.    No vaginal discharge, erythema or tenderness.      Right Adnexa: not tender and no mass present.    Left Adnexa: not tender and no mass present.    No cervical motion tenderness, friability or polyp.     Uterus is not enlarged or tender.  Breasts:     Right: No mass, nipple discharge, skin change or tenderness.     Left: No mass, nipple discharge, skin change or tenderness.    Neck:     Thyroid: No thyromegaly.   Cardiovascular:     Rate and Rhythm: Normal rate and regular rhythm.     Heart sounds: Normal heart sounds. No murmur heard.   Pulmonary:     Effort: Pulmonary effort is normal.     Breath sounds: Normal breath sounds.  Abdominal:     Palpations: Abdomen is soft.     Tenderness: There is no abdominal tenderness. There is no guarding or rebound.  Musculoskeletal:        General: Normal range of motion.     Cervical back: Normal range of motion.  Lymphadenopathy:     Cervical: No cervical adenopathy.  Neurological:     General: No focal deficit present.     Mental Status: She is alert and oriented to person, place, and time.     Cranial Nerves: No cranial nerve deficit.  Skin:    General: Skin is warm and dry.  Psychiatric:        Mood and Affect: Mood normal.        Behavior: Behavior normal.        Thought Content: Thought content normal.        Judgment: Judgment normal.  Vitals reviewed.     Assessment/Plan:  Encounter for annual routine gynecological examination  Encounter for screening mammogram for malignant neoplasm of breast; pt current on mammo  Family history of breast cancer--pt is MyRisk neg; IBIS=15%. No addl screening needed at this time.  Mixed hyperlipidemia--lipids normal on meds. Rx RF already sent. Recheck in 1 yr.          GYN counsel breast self exam, mammography screening, adequate intake of calcium and vitamin D, diet and exercise, tobacco cessation    F/U  Return in about 1 year (around 06/26/2021).  Sevastian Witczak B. Trenia Tennyson, PA-C 06/26/2020 8:13 AM

## 2020-06-26 ENCOUNTER — Encounter: Payer: Self-pay | Admitting: Obstetrics and Gynecology

## 2020-06-26 ENCOUNTER — Other Ambulatory Visit: Payer: Self-pay

## 2020-06-26 ENCOUNTER — Ambulatory Visit (INDEPENDENT_AMBULATORY_CARE_PROVIDER_SITE_OTHER): Payer: Managed Care, Other (non HMO) | Admitting: Obstetrics and Gynecology

## 2020-06-26 VITALS — BP 120/70 | Ht 66.0 in | Wt 180.0 lb

## 2020-06-26 DIAGNOSIS — E782 Mixed hyperlipidemia: Secondary | ICD-10-CM | POA: Diagnosis not present

## 2020-06-26 DIAGNOSIS — Z01419 Encounter for gynecological examination (general) (routine) without abnormal findings: Secondary | ICD-10-CM | POA: Diagnosis not present

## 2020-06-26 DIAGNOSIS — Z803 Family history of malignant neoplasm of breast: Secondary | ICD-10-CM

## 2020-06-26 DIAGNOSIS — Z1231 Encounter for screening mammogram for malignant neoplasm of breast: Secondary | ICD-10-CM | POA: Diagnosis not present

## 2020-06-26 NOTE — Patient Instructions (Signed)
I value your feedback and you entrusting us with your care. If you get a [facility] patient survey, I would appreciate you taking the time to let us know about your experience today. Thank you! ? ? ?

## 2020-11-30 IMAGING — MG DIGITAL SCREENING BILATERAL MAMMOGRAM WITH TOMO AND CAD
6 of 12 series · 6 of 36 positions shown · non-contrast
Comparison: Previous exam(s).

CLINICAL DATA: Screening.

EXAM:
DIGITAL SCREENING BILATERAL MAMMOGRAM WITH TOMO AND CAD

[R CC synth-2D (1 of 2)]
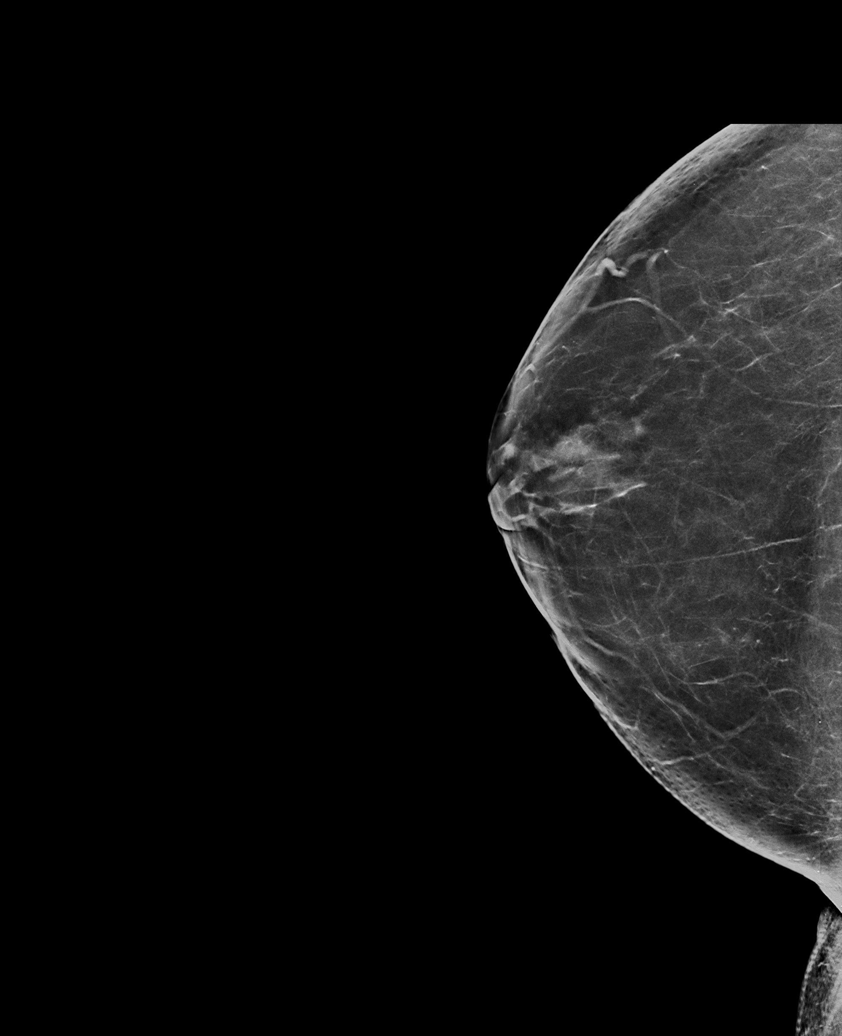

[R CC synth-2D (2 of 2)]
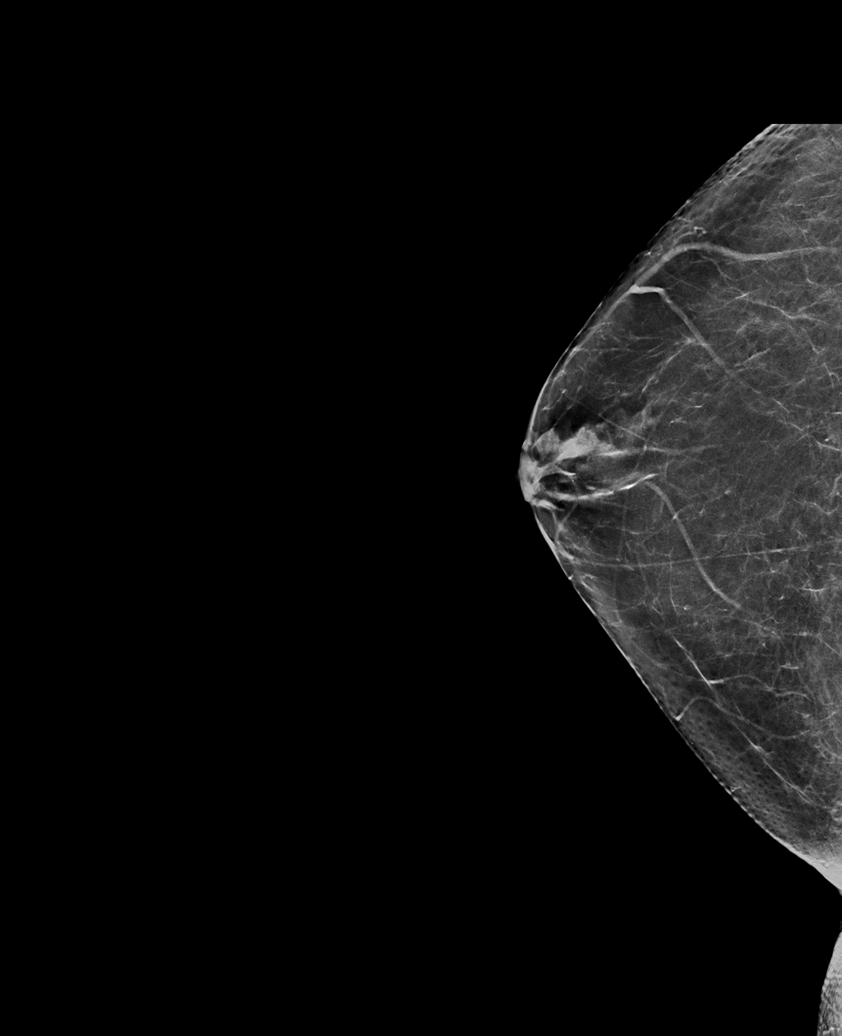

[L CC synth-2D (1 of 2)]
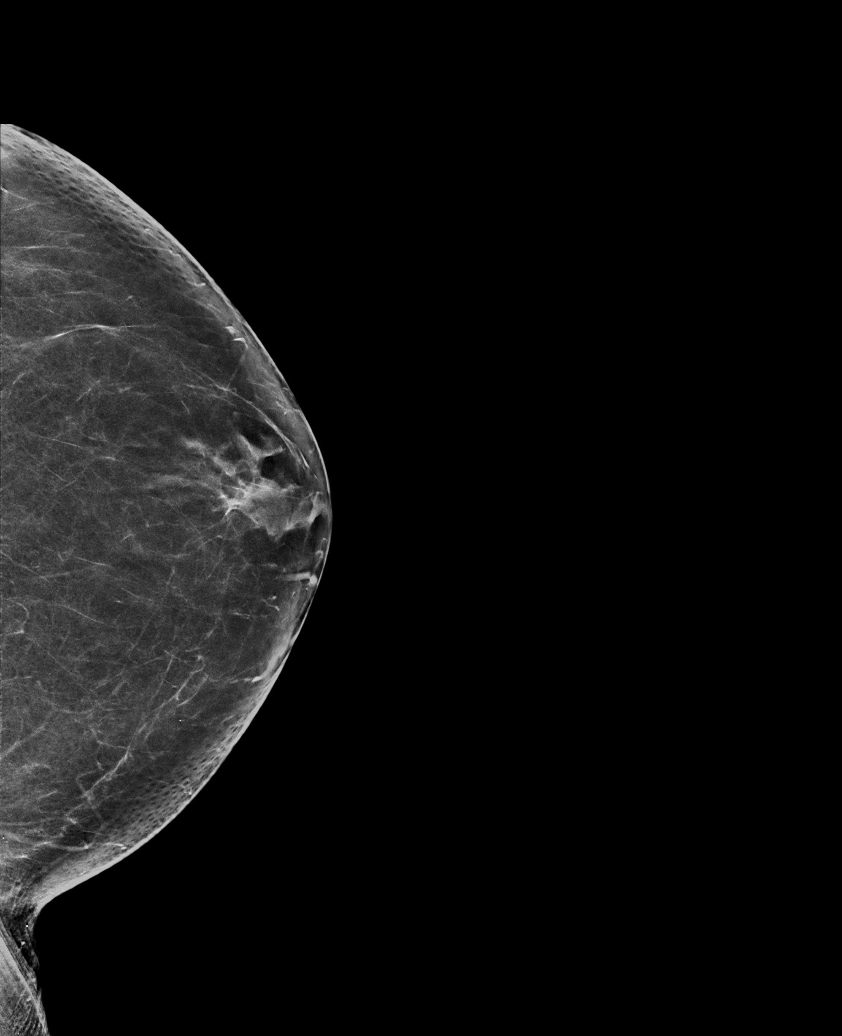

[R MLO synth-2D]
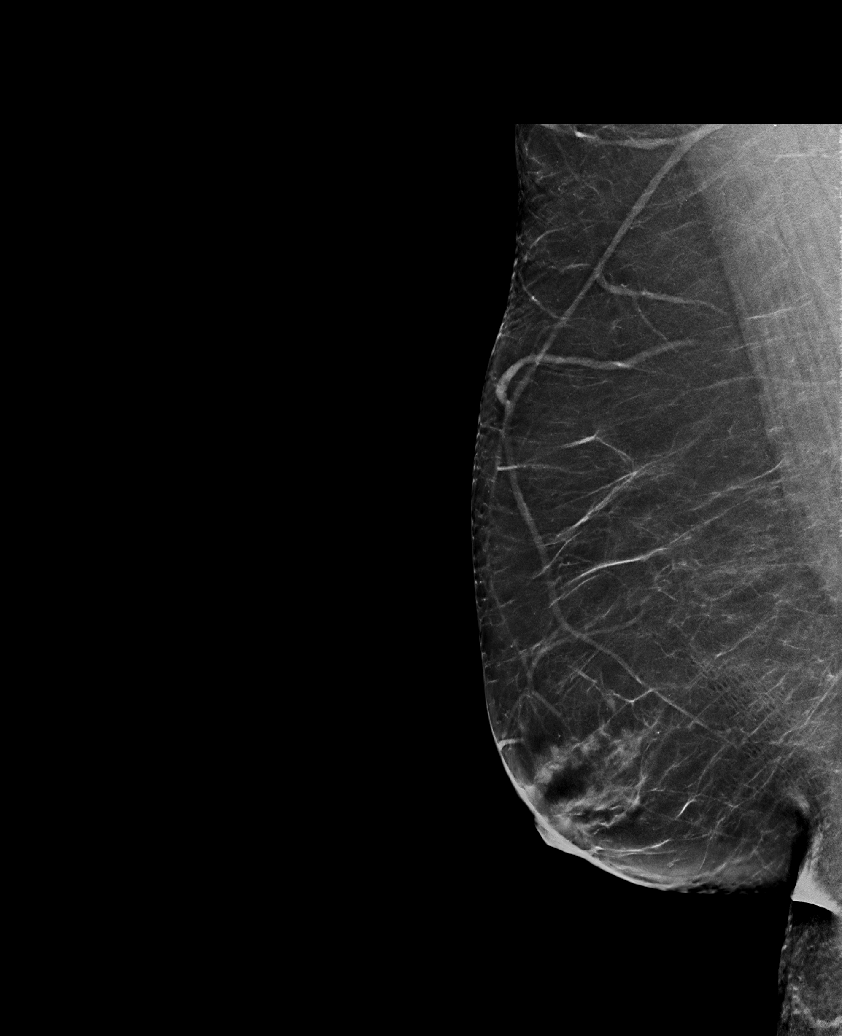

[L CC synth-2D (2 of 2)]
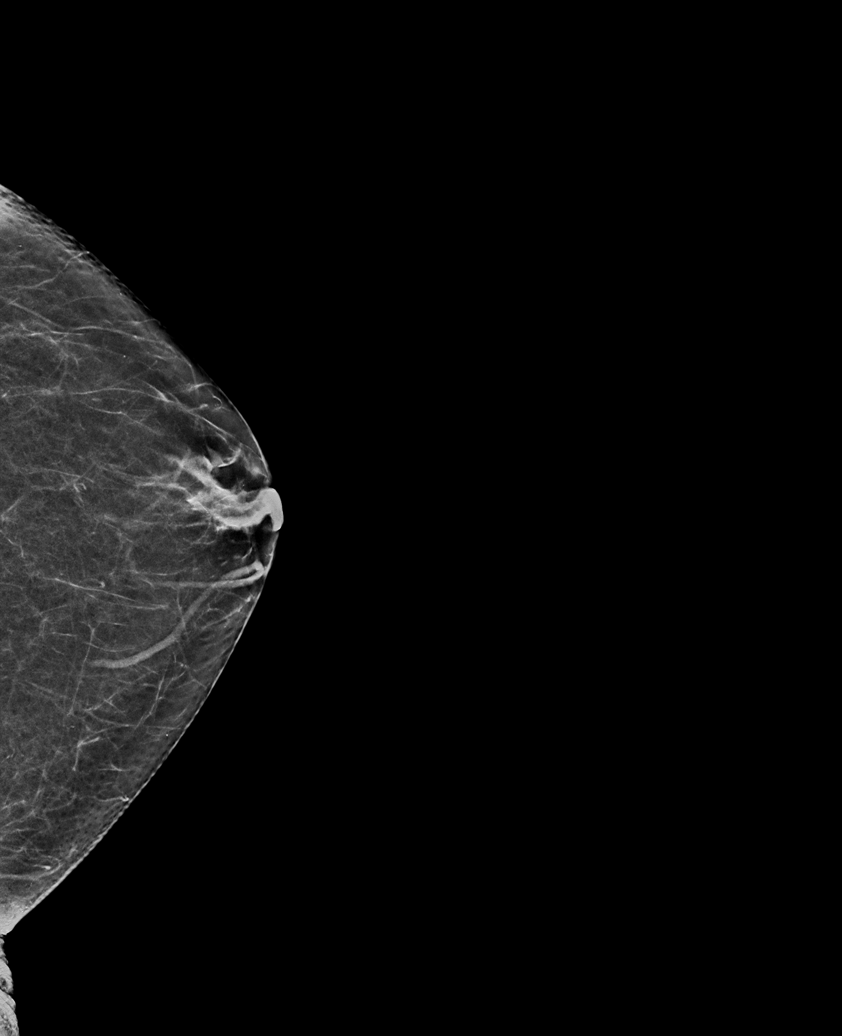

[L MLO synth-2D]
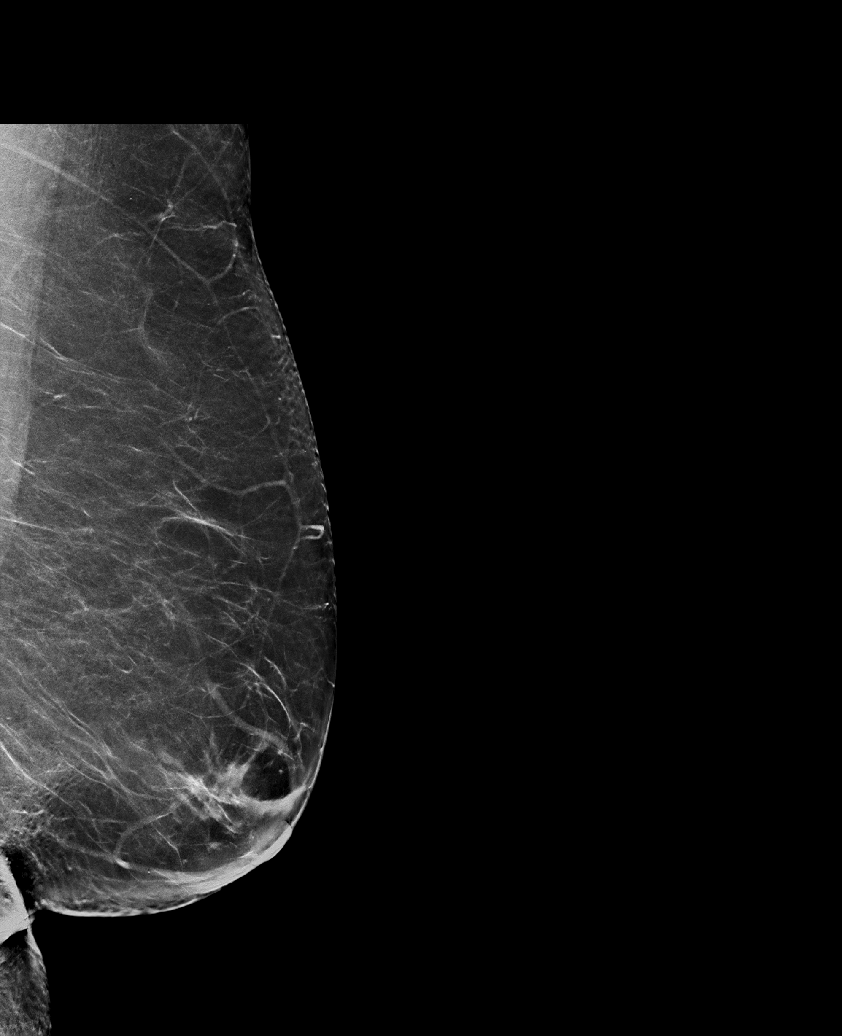

[6 of 36 positions shown; findings below may reference images not displayed]

ACR Breast Density Category b: There are scattered areas of
fibroglandular density.
FINDINGS: There are no findings suspicious for malignancy. Images were
processed with CAD.
IMPRESSION: No mammographic evidence of malignancy. A result letter of this
screening mammogram will be mailed directly to the patient.

RECOMMENDATION:
Screening mammogram in one year. (Code:[id number])

BI-RADS CATEGORY  1: Negative.

## 2021-06-06 ENCOUNTER — Other Ambulatory Visit: Payer: Self-pay | Admitting: Obstetrics and Gynecology

## 2021-06-06 DIAGNOSIS — E782 Mixed hyperlipidemia: Secondary | ICD-10-CM

## 2021-06-08 ENCOUNTER — Other Ambulatory Visit: Payer: Self-pay | Admitting: Obstetrics and Gynecology

## 2021-06-08 DIAGNOSIS — Z1231 Encounter for screening mammogram for malignant neoplasm of breast: Secondary | ICD-10-CM

## 2021-06-11 ENCOUNTER — Telehealth: Payer: Self-pay

## 2021-06-11 DIAGNOSIS — Z0000 Encounter for general adult medical examination without abnormal findings: Secondary | ICD-10-CM

## 2021-06-11 DIAGNOSIS — E782 Mixed hyperlipidemia: Secondary | ICD-10-CM

## 2021-06-11 NOTE — Addendum Note (Signed)
Addended by: Althea Grimmer B on: 06/11/2021 10:11 AM ? ? Modules accepted: Orders ? ?

## 2021-06-11 NOTE — Telephone Encounter (Signed)
Pt calling for blood work orders to be put in for [facility] in [place] before her annual appt.  [phone] ?

## 2021-06-11 NOTE — Telephone Encounter (Signed)
Pt aware.

## 2021-06-21 ENCOUNTER — Ambulatory Visit: Payer: Managed Care, Other (non HMO)

## 2021-06-23 ENCOUNTER — Other Ambulatory Visit: Payer: Self-pay | Admitting: Obstetrics and Gynecology

## 2021-06-23 DIAGNOSIS — E782 Mixed hyperlipidemia: Secondary | ICD-10-CM

## 2021-06-23 LAB — LIPID PANEL
Chol/HDL Ratio: 3.1 ratio (ref 0.0–4.4)
Cholesterol, Total: 197 mg/dL (ref 100–199)
HDL: 63 mg/dL (ref >39)
LDL Chol Calc (NIH): 126 mg/dL — ABNORMAL HIGH (ref 0–99)
Triglycerides: 44 mg/dL (ref 0–149)
VLDL Cholesterol Cal: 8 mg/dL (ref 5–40)

## 2021-06-23 LAB — COMPREHENSIVE METABOLIC PANEL
ALT: 10 IU/L (ref 0–32)
AST: 16 IU/L (ref 0–40)
Albumin/Globulin Ratio: 2.6 — ABNORMAL HIGH (ref 1.2–2.2)
Albumin: 4.6 g/dL (ref 3.8–4.9)
Alkaline Phosphatase: 64 IU/L (ref 44–121)
BUN/Creatinine Ratio: 18 (ref 12–28)
BUN: 13 mg/dL (ref 8–27)
Bilirubin Total: 0.2 mg/dL (ref 0.0–1.2)
CO2: 22 mmol/L (ref 20–29)
Calcium: 9.6 mg/dL (ref 8.7–10.3)
Chloride: 101 mmol/L (ref 96–106)
Creatinine, Ser: 0.72 mg/dL (ref 0.57–1.00)
Globulin, Total: 1.8 g/dL (ref 1.5–4.5)
Glucose: 89 mg/dL (ref 70–99)
Potassium: 4.4 mmol/L (ref 3.5–5.2)
Sodium: 137 mmol/L (ref 134–144)
Total Protein: 6.4 g/dL (ref 6.0–8.5)
eGFR: 96 mL/min/{1.73_m2} (ref >59)

## 2021-06-23 MED ORDER — FENOFIBRATE 54 MG PO TABS: 108.0000 mg | ORAL_TABLET | Freq: Every day | ORAL | 3 refills | Status: DC

## 2021-07-12 NOTE — Progress Notes (Signed)
? ?PCP: System, Provider Not In ? ? ?Chief Complaint  ?Patient presents with  ? Gynecologic Exam  ?  No concerns  ? ? ?HPI: ?     [name] is a 61 y.o. No obstetric history on file. who LMP was No LMP recorded. Patient is postmenopausal., presents today for her annual examination.  Her menses are absent due to menopause. She does not have PMB. No longer has vasomotor sx. Pelvic pain from 06/14/20 resolved. ? ?Sex activity: not sexually active. She does not have vaginal dryness/sx. ? ?Last Pap: 04/08/17  Results were: no abnormalities /neg HPV DNA.  ?Hx of STDs: none ? ?Last mammogram: 06/13/20  Results were: normal--routine follow-up in 12 months; has appt 4/23 ?There is a FH of breast cancer in her sister. Pt is MyRisk neg 2015, IBIS=15%. There is a FH of ovarian cancer in her mat aunt. The patient does self-breast exams. ? ?Colonoscopy: colonoscopy 2016 without abnormalities.  Repeat due after 10 years.  ? ?Tobacco use: 3/4-1 ppd for many yrs. Not ready to quit. Had stable CXR 2/20 ?Alcohol use: social drinker  ?No drug use ?Exercise: mod active ? ?She does get adequate calcium and Vitamin D in her diet. ? ?She has hyperlipidemia and takes fenofibrate 2-54 mg tabs daily (had to change Rx due to insurance 1/20). She can't tolerate statins. Had normal labs 3/23. Rx RF already sent. Has done well with wt loss. ? ?Past Medical History:  ?Diagnosis Date  ? Abnormal Pap smear of cervix .  ? Anemia   ? Bacterial vaginosis   ? BRCA negative 07/2013  ? MyRisk neg  ? COPD (chronic obstructive pulmonary disease) ([place])   ? mild, no meds  ? Dysmenorrhea   ? Endometriosis   ? Family history of breast cancer 07/2013  ? MyRisk neg; IBIS=15%  ? Family history of ovarian cancer   ? MyRisk neg 5/15  ? GERD (gastroesophageal reflux disease)   ? occasional heartburn  ? Hypercholesterolemia   ? Menorrhagia   ? Osteopenia   ? Ovarian cyst   ? ? ?Past Surgical History:  ?Procedure Laterality Date  ? BACK SURGERY    ?  decompression,lower lumbar  ? COLONOSCOPY N/A 10/19/2014  ? Procedure: COLONOSCOPY;  Surgeon: Hulen Luster, MD;  Location: [facility];  Service: Gastroenterology;  Laterality: N/A;  ? DIAGNOSTIC LAPAROSCOPY    ? LAPAROSCOPIC ENDOMETRIOSIS FULGURATION    ? ? ?Family History  ?Problem Relation Age of Onset  ? Coronary artery disease Father   ? Breast cancer Sister 96  ?     recurrance at age 79  ? Endometriosis Sister   ? Cervical cancer Sister 55  ? Lymphoma Brother 67  ?     non hodgkins  ? Coronary artery disease Brother   ? Lung cancer Brother 64  ? Cancer Brother   ?     gallbladder  ? Ovarian cancer Maternal Aunt   ? ? ?Social History  ? ?Socioeconomic History  ? Marital status: Married  ?  Spouse name: Not on file  ? Number of children: Not on file  ? Years of education: Not on file  ? Highest education level: Not on file  ?Occupational History  ? Not on file  ?Tobacco Use  ? Smoking status: Every Day  ?  Packs/day: 1.00  ?  Years: 40.00  ?  Pack years: 40.00  ?  Types: Cigarettes  ? Smokeless tobacco: Never  ?Vaping Use  ?  Vaping Use: Never used  ?Substance and Sexual Activity  ? Alcohol use: Yes  ?  Comment: rarely wine  ? Drug use: No  ? Sexual activity: Not Currently  ?  Birth control/protection: Post-menopausal  ?Other Topics Concern  ? Not on file  ?Social History Narrative  ? Not on file  ? ?Social Determinants of Health  ? ?Financial Resource Strain: Not on file  ?Food Insecurity: Not on file  ?Transportation Needs: Not on file  ?Physical Activity: Not on file  ?Stress: Not on file  ?Social Connections: Not on file  ?Intimate Partner Violence: Not on file  ? ? ?Current Meds  ?Medication Sig  ? b complex vitamins capsule Take 1 capsule by mouth daily.  ? cholecalciferol (VITAMIN D) 400 UNITS TABS tablet Take 800 Units by mouth daily.  ? Coenzyme Q10 (COQ10) 100 MG CAPS Take by mouth daily.  ? Esomeprazole Magnesium (NEXIUM 24HR PO) Take by mouth.  ? fenofibrate 54 MG tablet Take 2 tablets (108 mg  total) by mouth daily.  ? Resveratrol 250 MG CAPS Take 2 capsules by mouth daily. am  ? ? ? ? ?ROS: ? ?Review of Systems  ?Constitutional:  Negative for fatigue, fever and unexpected weight change.  ?Respiratory:  Negative for cough, shortness of breath and wheezing.   ?Cardiovascular:  Negative for chest pain, palpitations and leg swelling.  ?Gastrointestinal:  Negative for blood in stool, constipation, diarrhea, nausea and vomiting.  ?Endocrine: Negative for cold intolerance, heat intolerance and polyuria.  ?Genitourinary:  Negative for dyspareunia, dysuria, flank pain, frequency, genital sores, hematuria, menstrual problem, pelvic pain, urgency, vaginal bleeding, vaginal discharge and vaginal pain.  ?Musculoskeletal:  Negative for back pain, joint swelling and myalgias.  ?Skin:  Negative for rash.  ?Neurological:  Negative for dizziness, syncope, light-headedness, numbness and headaches.  ?Hematological:  Negative for adenopathy.  ?Psychiatric/Behavioral:  Negative for agitation, confusion, sleep disturbance and suicidal ideas. The patient is not nervous/anxious.   ? ? ?Objective: ?BP 120/70   Ht $R'5\' 6"'kZ$  (1.676 m)   Wt 186 lb (84.4 kg)   BMI 30.02 kg/m?  ? ? ?Physical Exam ?Constitutional:   ?   Appearance: She is well-developed.  ?Genitourinary:  ?   Vulva normal.  ?   Right Labia: No rash, tenderness or lesions. ?   Left Labia: No tenderness, lesions or rash. ?   No vaginal discharge, erythema or tenderness.  ? ?   Right Adnexa: not tender and no mass present. ?   Left Adnexa: not tender and no mass present. ?   No cervical motion tenderness, friability or polyp.  ?   Uterus is not enlarged or tender.  ?Breasts: ?   Right: No mass, nipple discharge, skin change or tenderness.  ?   Left: No mass, nipple discharge, skin change or tenderness.  ?Neck:  ?   Thyroid: No thyromegaly.  ?Cardiovascular:  ?   Rate and Rhythm: Normal rate and regular rhythm.  ?   Heart sounds: Normal heart sounds. No murmur  heard. ?Pulmonary:  ?   Effort: Pulmonary effort is normal.  ?   Breath sounds: Normal breath sounds.  ?Abdominal:  ?   Palpations: Abdomen is soft.  ?   Tenderness: There is no abdominal tenderness. There is no guarding or rebound.  ?Musculoskeletal:     ?   General: Normal range of motion.  ?   Cervical back: Normal range of motion.  ?Lymphadenopathy:  ?   Cervical: No cervical adenopathy.  ?  Neurological:  ?   General: No focal deficit present.  ?   Mental Status: She is alert and oriented to person, place, and time.  ?   Cranial Nerves: No cranial nerve deficit.  ?Skin: ?   General: Skin is warm and dry.  ?Psychiatric:     ?   Mood and Affect: Mood normal.     ?   Behavior: Behavior normal.     ?   Thought Content: Thought content normal.     ?   Judgment: Judgment normal.  ?Vitals reviewed.  ? ? ?Assessment/Plan: ? ?Encounter for annual routine gynecological examination ? ?Encounter for screening mammogram for malignant neoplasm of breast; pt current on mammo ? ?Family history of breast cancer--pt is MyRisk neg; IBIS=15%. No addl screening needed at this time. ? ?Mixed hyperlipidemia--lipids normal on meds. Rx RF already sent. Recheck in 1 yr. ? ?        ?GYN counsel breast self exam, mammography screening, adequate intake of calcium and vitamin D, diet and exercise, tobacco cessation ? ?  F/U ? Return in about 1 year (around 07/17/2022). ? ?Mizuki Hoel B. Damin Salido, PA-C ?07/16/2021 ?9:32 AM ?

## 2021-07-16 ENCOUNTER — Other Ambulatory Visit (HOSPITAL_COMMUNITY)
Admission: RE | Admit: 2021-07-16 | Discharge: 2021-07-16 | Disposition: A | Discharge status: Home / Self Care | Payer: Managed Care, Other (non HMO) | Source: Ambulatory Visit | Attending: Obstetrics and Gynecology | Admitting: Obstetrics and Gynecology

## 2021-07-16 ENCOUNTER — Ambulatory Visit (INDEPENDENT_AMBULATORY_CARE_PROVIDER_SITE_OTHER): Payer: Managed Care, Other (non HMO) | Admitting: Obstetrics and Gynecology

## 2021-07-16 ENCOUNTER — Encounter: Payer: Self-pay | Admitting: Obstetrics and Gynecology

## 2021-07-16 VITALS — BP 120/70 | Ht 66.0 in | Wt 186.0 lb

## 2021-07-16 DIAGNOSIS — Z01419 Encounter for gynecological examination (general) (routine) without abnormal findings: Secondary | ICD-10-CM

## 2021-07-16 DIAGNOSIS — Z124 Encounter for screening for malignant neoplasm of cervix: Secondary | ICD-10-CM

## 2021-07-16 DIAGNOSIS — Z1151 Encounter for screening for human papillomavirus (HPV): Secondary | ICD-10-CM | POA: Diagnosis not present

## 2021-07-16 DIAGNOSIS — Z0000 Encounter for general adult medical examination without abnormal findings: Secondary | ICD-10-CM

## 2021-07-16 DIAGNOSIS — Z1231 Encounter for screening mammogram for malignant neoplasm of breast: Secondary | ICD-10-CM | POA: Diagnosis not present

## 2021-07-16 DIAGNOSIS — E782 Mixed hyperlipidemia: Secondary | ICD-10-CM

## 2021-07-16 NOTE — Patient Instructions (Signed)
I value your feedback and you entrusting us with your care. If you get a [facility] patient survey, I would appreciate you taking the time to let us know about your experience today. Thank you! ? ? ?

## 2021-07-17 LAB — CYTOLOGY - PAP
Comment: NEGATIVE
Diagnosis: NEGATIVE
High risk HPV: NEGATIVE

## 2021-07-24 ENCOUNTER — Ambulatory Visit
Admission: RE | Admit: 2021-07-24 | Discharge: 2021-07-24 | Disposition: A | Discharge status: Home / Self Care | Payer: Managed Care, Other (non HMO) | Source: Ambulatory Visit | Attending: Obstetrics and Gynecology | Admitting: Obstetrics and Gynecology

## 2021-07-24 DIAGNOSIS — Z1231 Encounter for screening mammogram for malignant neoplasm of breast: Secondary | ICD-10-CM | POA: Diagnosis present

## 2021-09-21 ENCOUNTER — Other Ambulatory Visit: Payer: Self-pay | Admitting: Obstetrics and Gynecology

## 2021-09-21 DIAGNOSIS — E782 Mixed hyperlipidemia: Secondary | ICD-10-CM

## 2021-12-17 ENCOUNTER — Encounter: Payer: Self-pay | Admitting: Urology

## 2021-12-17 ENCOUNTER — Ambulatory Visit (INDEPENDENT_AMBULATORY_CARE_PROVIDER_SITE_OTHER): Payer: Managed Care, Other (non HMO) | Admitting: Urology

## 2021-12-17 VITALS — BP 145/85 | HR 81 | Ht 66.0 in | Wt 184.0 lb

## 2021-12-17 DIAGNOSIS — R39198 Other difficulties with micturition: Secondary | ICD-10-CM | POA: Diagnosis not present

## 2021-12-17 DIAGNOSIS — N3000 Acute cystitis without hematuria: Secondary | ICD-10-CM

## 2021-12-17 DIAGNOSIS — R102 Pelvic and perineal pain: Secondary | ICD-10-CM | POA: Diagnosis not present

## 2021-12-17 LAB — URINALYSIS, COMPLETE
Bilirubin, UA: NEGATIVE
Glucose, UA: NEGATIVE
Ketones, UA: NEGATIVE
Leukocytes,UA: NEGATIVE
Nitrite, UA: NEGATIVE
Protein,UA: NEGATIVE
RBC, UA: NEGATIVE
Specific Gravity, UA: 1.015 (ref 1.005–1.030)
Urobilinogen, Ur: 0.2 mg/dL (ref 0.2–1.0)
pH, UA: 8.5 — ABNORMAL HIGH (ref 5.0–7.5)

## 2021-12-17 LAB — MICROSCOPIC EXAMINATION
Bacteria, UA: NONE SEEN
RBC, Urine: NONE SEEN /hpf (ref 0–2)

## 2021-12-17 NOTE — Progress Notes (Signed)
12/17/2021 9:01 AM   Vinetta R Arnoldo Morale [birth year] [id number]  Referring provider: No referring provider defined for this encounter.  Chief Complaint  Patient presents with   New Patient (Initial Visit)    Pelvic pain with voiding    HPI: I was consulted to assess the patient's suprapubic discomfort that she had 2 months ago that spontaneously normalized.  Same occurred a year ago.  She describes a negative urine at urgent care and a negative gynecological evaluation  She normally voids every 2 hours and gets up once at night.  She is uncommon minimal incontinence not wearing a pad  No hysterectomy.  She has a smoking history.  She had low back surgery  No history of bladder surgery kidney stones or bladder infections  Normal urine culture 1 year ago on medical record   PMH: Past Medical History:  Diagnosis Date   Abnormal Pap smear of cervix .   Anemia    Bacterial vaginosis    BRCA negative 07/2013   MyRisk neg   COPD (chronic obstructive pulmonary disease) ([facility])    mild, no meds   Dysmenorrhea    Endometriosis    Family history of breast cancer 07/2013   MyRisk neg; IBIS=15%   Family history of ovarian cancer    MyRisk neg 5/15   GERD (gastroesophageal reflux disease)    occasional heartburn   Hypercholesterolemia    Menorrhagia    Osteopenia    Ovarian cyst     Surgical History: Past Surgical History:  Procedure Laterality Date   BACK SURGERY     decompression,lower lumbar   COLONOSCOPY N/A 10/19/2014   Procedure: COLONOSCOPY;  Surgeon: Hulen Luster, MD;  Location: [facility];  Service: Gastroenterology;  Laterality: N/A;   DIAGNOSTIC LAPAROSCOPY     LAPAROSCOPIC ENDOMETRIOSIS FULGURATION      Home Medications:  Allergies as of 12/17/2021   No Known Allergies      Medication List        Accurate as of December 17, 2021  9:01 AM. If you have any questions, ask your nurse or doctor.          b complex vitamins capsule Take 1  capsule by mouth daily.   cholecalciferol 10 MCG (400 UNIT) Tabs tablet Commonly known as: VITAMIN D3 Take 800 Units by mouth daily.   CoQ10 100 MG Caps Take by mouth daily.   fenofibrate 54 MG tablet Take 2 tablets (108 mg total) by mouth daily.   NEXIUM 24HR PO Take by mouth.   Resveratrol 250 MG Caps Take 2 capsules by mouth daily. am        Allergies: No Known Allergies  Family History: Family History  Problem Relation Age of Onset   Coronary artery disease Father    Breast cancer Sister 46       recurrance at age 59   Endometriosis Sister    Cervical cancer Sister 40   Lymphoma Brother 34       non hodgkins   Coronary artery disease Brother    Lung cancer Brother 15   Cancer Brother        gallbladder   Ovarian cancer Maternal Aunt     Social History:  reports that she has been smoking cigarettes. She has a 40.00 pack-year smoking history. She has never used smokeless tobacco. She reports current alcohol use. She reports that she does not use drugs.  ROS:  Physical Exam: There were no vitals taken for this visit.  Constitutional:  Alert and oriented, No acute distress. HEENT: [place] AT, moist mucus membranes.  Trachea midline, no masses.   Laboratory Data: No results found for: "WBC", "HGB", "HCT", "MCV", "PLT"  Lab Results  Component Value Date   CREATININE 0.72 06/22/2021    No results found for: "PSA"  No results found for: "TESTOSTERONE"  No results found for: "HGBA1C"  Urinalysis    Component Value Date/Time   COLORURINE YELLOW 05/17/2020 [place] 05/17/2020 1624   LABSPEC 1.015 05/17/2020 1624   PHURINE 7.0 05/17/2020 1624   GLUCOSEU NEGATIVE 05/17/2020 [place] 05/17/2020 1624   BILIRUBINUR neg 06/14/2020 [place] 05/17/2020 1624   PROTEINUR Negative 06/14/2020 1520   PROTEINUR NEGATIVE 05/17/2020 1624   NITRITE neg  06/14/2020 1520   NITRITE NEGATIVE 05/17/2020 1624   LEUKOCYTESUR Negative 06/14/2020 1520   LEUKOCYTESUR NEGATIVE 05/17/2020 1624    Pertinent Imaging: Urine reviewed.  Urine sent for culture.  Chart reviewed.  No blood in urine  Assessment & Plan: Patient may have had a bladder infection.  With a smoking history I thought was best to have her come back and perform cystoscopy and pelvic examination  1. Pelvic pain in female  - Urinalysis, Complete - CULTURE, URINE COMPREHENSIVE   No follow-ups on file.  Reece Packer, MD  [facility] [address] [place], [place] [address] [phone]

## 2021-12-20 LAB — CULTURE, URINE COMPREHENSIVE

## 2021-12-28 ENCOUNTER — Telehealth: Payer: Self-pay | Admitting: *Deleted

## 2021-12-28 NOTE — Telephone Encounter (Signed)
pt l/m wants to know why she has not received a call regarding the abnormal UA she saw on [place]  Please call her [phone] (M)   Thank you

## 2021-12-28 NOTE — Telephone Encounter (Signed)
Spoke with patient and advised that she does not have a true infection.   Verbally spoke with Dr. Erlene Quan, who looked at chart.  Pt understandable.

## 2022-01-28 ENCOUNTER — Ambulatory Visit (INDEPENDENT_AMBULATORY_CARE_PROVIDER_SITE_OTHER): Payer: Managed Care, Other (non HMO) | Admitting: Urology

## 2022-01-28 VITALS — BP 128/84 | HR 73 | Ht 66.0 in | Wt 188.0 lb

## 2022-01-28 DIAGNOSIS — R102 Pelvic and perineal pain: Secondary | ICD-10-CM | POA: Diagnosis not present

## 2022-01-28 DIAGNOSIS — R350 Frequency of micturition: Secondary | ICD-10-CM

## 2022-01-28 LAB — MICROSCOPIC EXAMINATION: Bacteria, UA: NONE SEEN

## 2022-01-28 LAB — URINALYSIS, COMPLETE
Bilirubin, UA: NEGATIVE
Glucose, UA: NEGATIVE
Ketones, UA: NEGATIVE
Leukocytes,UA: NEGATIVE
Nitrite, UA: NEGATIVE
Protein,UA: NEGATIVE
RBC, UA: NEGATIVE
Specific Gravity, UA: 1.01 (ref 1.005–1.030)
Urobilinogen, Ur: 0.2 mg/dL (ref 0.2–1.0)
pH, UA: 6.5 (ref 5.0–7.5)

## 2022-01-28 NOTE — Progress Notes (Signed)
01/28/2022 8:46 AM   Eileen Cunningham 1960-09-27 [id number]  Referring provider: No referring provider defined for this encounter.  Chief Complaint  Patient presents with   Cysto    HPI: I was consulted to assess the patient's suprapubic discomfort that she had 2 months ago that spontaneously normalized.  Same occurred a year ago.  She describes a negative urine at urgent care and a negative gynecological evaluation   She normally voids every 2 hours and gets up once at night.  She is uncommon minimal incontinence not wearing a pad   No hysterectomy.  She has a smoking history.  She had low back surgery    Patient may have had a bladder infection.  With a smoking history I thought was best to have her come back and perform cystoscopy and pelvic examination  Today Frequency stable.  Her urine culture showed 1000 colonies of Streptococcus not treated to be repeated today.  She had a normal pelvic ultrasound on June 14, 2020  No longer experiencing abdominal discomfort when she does it comes and goes and is milder  On pelvic examination she grade 2 hypermobility the bladder neck and no stress incontinence or prolapse was significant  Cystoscopy: Patient underwent flexible cystoscopy.  Bladder mucosa and trigone were normal.  No cystitis.  No carcinoma.  No pain on bladder filling    PMH: Past Medical History:  Diagnosis Date   Abnormal Pap smear of cervix .   Anemia    Bacterial vaginosis    BRCA negative 07/2013   MyRisk neg   COPD (chronic obstructive pulmonary disease) ([facility])    mild, no meds   Dysmenorrhea    Endometriosis    Family history of breast cancer 07/2013   MyRisk neg; IBIS=15%   Family history of ovarian cancer    MyRisk neg 5/15   GERD (gastroesophageal reflux disease)    occasional heartburn   Hypercholesterolemia    Menorrhagia    Osteopenia    Ovarian cyst     Surgical History: Past Surgical History:  Procedure Laterality Date   BACK  SURGERY     decompression,lower lumbar   COLONOSCOPY N/A 10/19/2014   Procedure: COLONOSCOPY;  Surgeon: Hulen Luster, MD;  Location: [facility];  Service: Gastroenterology;  Laterality: N/A;   DIAGNOSTIC LAPAROSCOPY     LAPAROSCOPIC ENDOMETRIOSIS FULGURATION      Home Medications:  Allergies as of 01/28/2022   No Known Allergies      Medication List        Accurate as of January 28, 2022  8:46 AM. If you have any questions, ask your nurse or doctor.          b complex vitamins capsule Take 1 capsule by mouth daily.   cholecalciferol 10 MCG (400 UNIT) Tabs tablet Commonly known as: VITAMIN D3 Take 800 Units by mouth daily.   CoQ10 100 MG Caps Take by mouth daily.   fenofibrate 54 MG tablet Take 2 tablets (108 mg total) by mouth daily.   NEXIUM 24HR PO Take by mouth.   Resveratrol 250 MG Caps Take 2 capsules by mouth daily. am        Allergies: No Known Allergies  Family History: Family History  Problem Relation Age of Onset   Coronary artery disease Father    Breast cancer Sister 65       recurrance at age 42   Endometriosis Sister    Cervical cancer Sister 57   Lymphoma Brother  5       non hodgkins   Coronary artery disease Brother    Lung cancer Brother 74   Cancer Brother        gallbladder   Ovarian cancer Maternal Aunt     Social History:  reports that she has been smoking cigarettes. She has a 40.00 pack-year smoking history. She has been exposed to tobacco smoke. She has never used smokeless tobacco. She reports current alcohol use. She reports that she does not use drugs.  ROS:                                        Physical Exam: There were no vitals taken for this visit.  Constitutional:  Alert and oriented, No acute distress. HEENT: [place] AT, moist mucus membranes.  Trachea midline, no masses. Cardiovascular: No clubbing, cyanosis, or edema.  Laboratory Data: No results found for: "WBC", "HGB", "HCT",  "MCV", "PLT"  Lab Results  Component Value Date   CREATININE 0.72 06/22/2021    No results found for: "PSA"  No results found for: "TESTOSTERONE"  No results found for: "HGBA1C"  Urinalysis    Component Value Date/Time   COLORURINE YELLOW 05/17/2020 1624   APPEARANCEUR Clear 12/17/2021 0913   LABSPEC 1.015 05/17/2020 1624   PHURINE 7.0 05/17/2020 1624   GLUCOSEU Negative 12/17/2021 0913   HGBUR NEGATIVE 05/17/2020 1624   BILIRUBINUR Negative 12/17/2021 0913   KETONESUR NEGATIVE 05/17/2020 1624   PROTEINUR Negative 12/17/2021 0913   PROTEINUR NEGATIVE 05/17/2020 1624   NITRITE Negative 12/17/2021 0913   NITRITE NEGATIVE 05/17/2020 1624   LEUKOCYTESUR Negative 12/17/2021 0913   LEUKOCYTESUR NEGATIVE 05/17/2020 1624    Pertinent Imaging:   Assessment & Plan: Clinically not infected.  I have cleared her from my urologic standpoint for some intermittent mild discomfort of the abdomen.  I discussed it with her and I do not think she needs a CT scan.  Call if urine culture positive  1. Pelvic pain in female  - Urinalysis, Complete   No follow-ups on file.  Reece Packer, MD  [facility] [address], [place],  [address] [phone]

## 2022-01-31 LAB — CULTURE, URINE COMPREHENSIVE

## 2022-06-13 ENCOUNTER — Other Ambulatory Visit: Payer: Self-pay | Admitting: Obstetrics and Gynecology

## 2022-06-13 DIAGNOSIS — E782 Mixed hyperlipidemia: Secondary | ICD-10-CM

## 2022-06-28 ENCOUNTER — Other Ambulatory Visit: Payer: Self-pay | Admitting: Obstetrics and Gynecology

## 2022-06-28 DIAGNOSIS — Z1231 Encounter for screening mammogram for malignant neoplasm of breast: Secondary | ICD-10-CM

## 2022-07-03 ENCOUNTER — Telehealth: Payer: Self-pay | Admitting: Obstetrics and Gynecology

## 2022-07-03 DIAGNOSIS — E782 Mixed hyperlipidemia: Secondary | ICD-10-CM

## 2022-07-03 DIAGNOSIS — Z0000 Encounter for general adult medical examination without abnormal findings: Secondary | ICD-10-CM

## 2022-07-03 DIAGNOSIS — Z131 Encounter for screening for diabetes mellitus: Secondary | ICD-10-CM

## 2022-07-03 NOTE — Telephone Encounter (Signed)
Patient (Eileen Cunningham) called this morning to schedule her annual appointment.  She is requesting that her blood work be ordered and sent to [facility] in [place].  She has stated that she has been out of her meds for a couple of weeks.

## 2022-07-04 ENCOUNTER — Other Ambulatory Visit: Payer: Self-pay | Admitting: Obstetrics and Gynecology

## 2022-07-04 DIAGNOSIS — Z131 Encounter for screening for diabetes mellitus: Secondary | ICD-10-CM

## 2022-07-04 DIAGNOSIS — Z0000 Encounter for general adult medical examination without abnormal findings: Secondary | ICD-10-CM

## 2022-07-04 DIAGNOSIS — E782 Mixed hyperlipidemia: Secondary | ICD-10-CM

## 2022-07-04 MED ORDER — FENOFIBRATE 54 MG PO TABS: 108.0000 mg | ORAL_TABLET | Freq: Every day | ORAL | 0 refills | Status: DC

## 2022-07-04 NOTE — Telephone Encounter (Signed)
Pt aware.

## 2022-07-04 NOTE — Telephone Encounter (Signed)
Checking labs is pointless right now since pt off meds for a few wks. Rx RF eRxd for chol Rx. Pt to restart meds and do labs in 4 wks (orders placed).

## 2022-07-04 NOTE — Progress Notes (Signed)
Rx RF fenofibrate till annual 5/24

## 2022-07-29 ENCOUNTER — Ambulatory Visit
Admission: RE | Admit: 2022-07-29 | Discharge: 2022-07-29 | Disposition: A | Discharge status: Home / Self Care | Payer: Managed Care, Other (non HMO) | Source: Ambulatory Visit | Attending: Obstetrics and Gynecology | Admitting: Obstetrics and Gynecology

## 2022-07-29 DIAGNOSIS — Z1231 Encounter for screening mammogram for malignant neoplasm of breast: Secondary | ICD-10-CM | POA: Insufficient documentation

## 2022-08-03 LAB — COMPREHENSIVE METABOLIC PANEL
ALT: 10 IU/L (ref 0–32)
AST: 12 IU/L (ref 0–40)
Albumin/Globulin Ratio: 2 (ref 1.2–2.2)
Albumin: 4.2 g/dL (ref 3.9–4.9)
Alkaline Phosphatase: 64 IU/L (ref 44–121)
BUN/Creatinine Ratio: 19 (ref 12–28)
BUN: 14 mg/dL (ref 8–27)
Bilirubin Total: 0.2 mg/dL (ref 0.0–1.2)
CO2: 20 mmol/L (ref 20–29)
Calcium: 9 mg/dL (ref 8.7–10.3)
Chloride: 99 mmol/L (ref 96–106)
Creatinine, Ser: 0.72 mg/dL (ref 0.57–1.00)
Globulin, Total: 2.1 g/dL (ref 1.5–4.5)
Glucose: 80 mg/dL (ref 70–99)
Potassium: 4.6 mmol/L (ref 3.5–5.2)
Sodium: 135 mmol/L (ref 134–144)
Total Protein: 6.3 g/dL (ref 6.0–8.5)
eGFR: 95 mL/min/{1.73_m2} (ref >59)

## 2022-08-03 LAB — LIPID PANEL
Chol/HDL Ratio: 3.5 ratio (ref 0.0–4.4)
Cholesterol, Total: 198 mg/dL (ref 100–199)
HDL: 57 mg/dL (ref >39)
LDL Chol Calc (NIH): 131 mg/dL — ABNORMAL HIGH (ref 0–99)
Triglycerides: 54 mg/dL (ref 0–149)
VLDL Cholesterol Cal: 10 mg/dL (ref 5–40)

## 2022-08-03 LAB — HEMOGLOBIN A1C
Est. average glucose Bld gHb Est-mCnc: 120 mg/dL
Hgb A1c MFr Bld: 5.8 % — ABNORMAL HIGH (ref 4.8–5.6)

## 2022-08-14 NOTE — Progress Notes (Unsigned)
PCP: System, Provider Not In   No chief complaint on file.   HPI:      [name] is a 62 y.o. No obstetric history on file. who LMP was No LMP recorded. Patient is postmenopausal., presents today for her annual examination.  Her menses are absent due to menopause. She does not have PMB. No longer has vasomotor sx. Pelvic pain from 06/14/20 resolved.  Sex activity: not sexually active. She does not have vaginal dryness/sx.  Last Pap: 07/16/21  Results were: no abnormalities /neg HPV DNA.  Hx of STDs: none  Last mammogram: 07/29/22  Results were: normal--routine follow-up in 12 months; has appt 4/23 There is a FH of breast cancer in her sister. Pt is MyRisk neg 2015, IBIS=15%. There is a FH of ovarian cancer in her mat aunt. The patient does self-breast exams.  Colonoscopy: colonoscopy 2016 without abnormalities.  Repeat due after 10 years.   Tobacco use: 3/4-1 ppd for many yrs. Not ready to quit. Had stable CXR 2/20 Alcohol use: social drinker  No drug use Exercise: mod active  She does get adequate calcium and Vitamin D in her diet.  She has hyperlipidemia and takes fenofibrate 2-54 mg tabs daily (had to change Rx due to insurance 1/20). She can't tolerate statins. Had normal labs 5/24. Rx RF needed. Also with pre-DM. Has done well with wt loss.  Past Medical History:  Diagnosis Date   Abnormal Pap smear of cervix .   Anemia    Bacterial vaginosis    BRCA negative 07/2013   MyRisk neg   COPD (chronic obstructive pulmonary disease) (HCC)    mild, no meds   Dysmenorrhea    Endometriosis    Family history of breast cancer 07/2013   MyRisk neg; IBIS=15%   Family history of ovarian cancer    MyRisk neg 5/15   GERD (gastroesophageal reflux disease)    occasional heartburn   Hypercholesterolemia    Menorrhagia    Osteopenia    Ovarian cyst     Past Surgical History:  Procedure Laterality Date   BACK SURGERY     decompression,lower lumbar   COLONOSCOPY N/A  10/19/2014   Procedure: COLONOSCOPY;  Surgeon: Wallace Cullens, MD;  Location: [facility];  Service: Gastroenterology;  Laterality: N/A;   DIAGNOSTIC LAPAROSCOPY     LAPAROSCOPIC ENDOMETRIOSIS FULGURATION      Family History  Problem Relation Age of Onset   Coronary artery disease Father    Breast cancer Sister 21       recurrance at age 4   Endometriosis Sister    Cervical cancer Sister 108   Lymphoma Brother 34       non hodgkins   Coronary artery disease Brother    Lung cancer Brother 50   Cancer Brother        gallbladder   Ovarian cancer Maternal Aunt     Social History   Socioeconomic History   Marital status: Married    Spouse name: Not on file   Number of children: Not on file   Years of education: Not on file   Highest education level: Not on file  Occupational History   Not on file  Tobacco Use   Smoking status: Every Day    Packs/day: 1.00    Years: 40.00    Additional pack years: 0.00    Total pack years: 40.00    Types: Cigarettes    Passive exposure: Current   Smokeless tobacco:  Never  Vaping Use   Vaping Use: Never used  Substance and Sexual Activity   Alcohol use: Yes    Comment: rarely wine   Drug use: No   Sexual activity: Not Currently    Birth control/protection: Post-menopausal  Other Topics Concern   Not on file  Social History Narrative   Not on file   Social Determinants of Health   Financial Resource Strain: Not on file  Food Insecurity: Not on file  Transportation Needs: Not on file  Physical Activity: Not on file  Stress: Not on file  Social Connections: Not on file  Intimate Partner Violence: Not on file    No outpatient medications have been marked as taking for the 08/15/22 encounter (Appointment) with Nikeya Maxim, Ilona Sorrel, PA-C.      ROS:  Review of Systems  Constitutional:  Negative for fatigue, fever and unexpected weight change.  Respiratory:  Negative for cough, shortness of breath and wheezing.    Cardiovascular:  Negative for chest pain, palpitations and leg swelling.  Gastrointestinal:  Negative for blood in stool, constipation, diarrhea, nausea and vomiting.  Endocrine: Negative for cold intolerance, heat intolerance and polyuria.  Genitourinary:  Negative for dyspareunia, dysuria, flank pain, frequency, genital sores, hematuria, menstrual problem, pelvic pain, urgency, vaginal bleeding, vaginal discharge and vaginal pain.  Musculoskeletal:  Negative for back pain, joint swelling and myalgias.  Skin:  Negative for rash.  Neurological:  Negative for dizziness, syncope, light-headedness, numbness and headaches.  Hematological:  Negative for adenopathy.  Psychiatric/Behavioral:  Negative for agitation, confusion, sleep disturbance and suicidal ideas. The patient is not nervous/anxious.      Objective: There were no vitals taken for this visit.   Physical Exam Constitutional:      Appearance: She is well-developed.  Genitourinary:     Vulva normal.     Right Labia: No rash, tenderness or lesions.    Left Labia: No tenderness, lesions or rash.    No vaginal discharge, erythema or tenderness.      Right Adnexa: not tender and no mass present.    Left Adnexa: not tender and no mass present.    No cervical motion tenderness, friability or polyp.     Uterus is not enlarged or tender.  Breasts:    Right: No mass, nipple discharge, skin change or tenderness.     Left: No mass, nipple discharge, skin change or tenderness.  Neck:     Thyroid: No thyromegaly.  Cardiovascular:     Rate and Rhythm: Normal rate and regular rhythm.     Heart sounds: Normal heart sounds. No murmur heard. Pulmonary:     Effort: Pulmonary effort is normal.     Breath sounds: Normal breath sounds.  Abdominal:     Palpations: Abdomen is soft.     Tenderness: There is no abdominal tenderness. There is no guarding or rebound.  Musculoskeletal:        General: Normal range of motion.     Cervical back:  Normal range of motion.  Lymphadenopathy:     Cervical: No cervical adenopathy.  Neurological:     General: No focal deficit present.     Mental Status: She is alert and oriented to person, place, and time.     Cranial Nerves: No cranial nerve deficit.  Skin:    General: Skin is warm and dry.  Psychiatric:        Mood and Affect: Mood normal.        Behavior: Behavior normal.  Thought Content: Thought content normal.        Judgment: Judgment normal.  Vitals reviewed.     Assessment/Plan:  Encounter for annual routine gynecological examination  Encounter for screening mammogram for malignant neoplasm of breast; pt current on mammo  Family history of breast cancer--pt is MyRisk neg; IBIS=15%. No addl screening needed at this time.  Mixed hyperlipidemia--lipids normal on meds. Rx RF already sent. Recheck in 1 yr.          GYN counsel breast self exam, mammography screening, adequate intake of calcium and vitamin D, diet and exercise, tobacco cessation    F/U  No follow-ups on file.  Vidya Bamford B. Trystin Hargrove, PA-C 08/14/2022 8:34 PM

## 2022-08-15 ENCOUNTER — Encounter: Payer: Self-pay | Admitting: Obstetrics and Gynecology

## 2022-08-15 ENCOUNTER — Ambulatory Visit: Payer: Managed Care, Other (non HMO) | Admitting: Obstetrics and Gynecology

## 2022-08-15 VITALS — BP 118/70 | Ht 66.0 in | Wt 197.0 lb

## 2022-08-15 DIAGNOSIS — Z1231 Encounter for screening mammogram for malignant neoplasm of breast: Secondary | ICD-10-CM

## 2022-08-15 DIAGNOSIS — Z01419 Encounter for gynecological examination (general) (routine) without abnormal findings: Secondary | ICD-10-CM

## 2022-08-15 DIAGNOSIS — F17200 Nicotine dependence, unspecified, uncomplicated: Secondary | ICD-10-CM

## 2022-08-15 DIAGNOSIS — E782 Mixed hyperlipidemia: Secondary | ICD-10-CM

## 2022-08-15 MED ORDER — FENOFIBRATE 54 MG PO TABS: 108.0000 mg | ORAL_TABLET | Freq: Every day | ORAL | 3 refills | Status: DC

## 2022-08-15 NOTE — Patient Instructions (Signed)
I value your feedback and you entrusting us with your care. If you get a [facility] patient survey, I would appreciate you taking the time to let us know about your experience today. Thank you! ? ? ?

## 2022-09-06 ENCOUNTER — Ambulatory Visit
Admission: RE | Admit: 2022-09-06 | Discharge: 2022-09-06 | Disposition: A | Discharge status: Home / Self Care | Payer: Managed Care, Other (non HMO) | Source: Ambulatory Visit | Attending: Obstetrics and Gynecology

## 2022-09-06 DIAGNOSIS — F17200 Nicotine dependence, unspecified, uncomplicated: Secondary | ICD-10-CM | POA: Insufficient documentation

## 2023-07-24 ENCOUNTER — Other Ambulatory Visit: Payer: Self-pay | Admitting: Obstetrics and Gynecology

## 2023-07-24 DIAGNOSIS — Z1231 Encounter for screening mammogram for malignant neoplasm of breast: Secondary | ICD-10-CM

## 2023-08-06 ENCOUNTER — Ambulatory Visit
Admission: RE | Admit: 2023-08-06 | Discharge: 2023-08-06 | Disposition: A | Discharge status: Home / Self Care | Payer: PRIVATE HEALTH INSURANCE | Source: Ambulatory Visit | Attending: Obstetrics and Gynecology | Admitting: Obstetrics and Gynecology

## 2023-08-06 DIAGNOSIS — Z1231 Encounter for screening mammogram for malignant neoplasm of breast: Secondary | ICD-10-CM | POA: Insufficient documentation

## 2023-08-10 ENCOUNTER — Encounter: Payer: Self-pay | Admitting: Obstetrics and Gynecology

## 2023-08-17 NOTE — Progress Notes (Signed)
 PCP: System, Provider Not In   Chief Complaint  Patient presents with   Gynecologic Exam    No concerns    HPI:      [name] is a 63 y.o. No obstetric history on file. who LMP was No LMP recorded. Patient is postmenopausal., presents today for her annual examination.  Her menses are absent due to menopause. She does not have PMB. No longer has vasomotor sx. Pelvic pain from 06/14/20 resolved.  Sex activity: not sexually active. She does not have vaginal dryness/sx. Has noticed odor with urination for a few months without UTI sx; no vag d/c/irritation/odor. Taking Vit supplements and wonders if cause of smell.   Last Pap: 07/16/21  Results were: no abnormalities /neg HPV DNA.  Hx of STDs: none  Last mammogram: 08/06/23  Results were: normal--routine follow-up in 12 months There is a FH of breast cancer in her sister. Pt is MyRisk neg 2015, IBIS=15%. There is a FH of ovarian cancer in her mat aunt. The patient does self-breast exams.  Colonoscopy: colonoscopy 2016 without abnormalities.  Repeat due after 10 years.   Tobacco use: 3/4-1 ppd for many yrs (>40 yr ppd, still smoking). Not ready to quit. Had stable CXR 2/20; benign lung CT 6/24 with COPD.  Alcohol use: social drinker  No drug use Exercise: mod active  She does get adequate calcium and Vitamin D in her diet.  She has hyperlipidemia and takes fenofibrate  2-54 mg tabs daily (had to change Rx due to insurance 1/20). She can't tolerate statins. Had normal labs 5/24, repeat due. Rx RF needed. Lung CT showed "aortic atherosclerosis, in addition to two-vessel coronary artery Disease". Pt has increased exercise, not ready to quit tobacco (can't do nicotine cessation products due to allergy to green nicotine which is in them).   Also with pre-DM. Had done well with wt loss, exercising more now.  Does have FH DM.   Past Medical History:  Diagnosis Date   Abnormal Pap smear of cervix .   Anemia    Bacterial vaginosis     BRCA negative 07/2013   MyRisk neg   COPD (chronic obstructive pulmonary disease) (HCC)    mild, no meds   Dysmenorrhea    Endometriosis    Family history of breast cancer 07/2013   MyRisk neg; IBIS=15%   Family history of ovarian cancer    MyRisk neg 5/15   GERD (gastroesophageal reflux disease)    occasional heartburn   Hypercholesterolemia    Menorrhagia    Osteopenia    Ovarian cyst     Past Surgical History:  Procedure Laterality Date   BACK SURGERY     decompression,lower lumbar   COLONOSCOPY N/A 10/19/2014   Procedure: COLONOSCOPY;  Surgeon: Stephens Eis, MD;  Location: [facility];  Service: Gastroenterology;  Laterality: N/A;   DIAGNOSTIC LAPAROSCOPY     LAPAROSCOPIC ENDOMETRIOSIS FULGURATION      Family History  Problem Relation Age of Onset   Coronary artery disease Father    Breast cancer Sister 90       recurrance at age 46   Endometriosis Sister    Cervical cancer Sister 23   Lymphoma Brother 60       non hodgkins   Coronary artery disease Brother    Lung cancer Brother 85   Cancer Brother        gallbladder   Ovarian cancer Maternal Aunt     Social History   Socioeconomic  History   Marital status: Married    Spouse name: Not on file   Number of children: Not on file   Years of education: Not on file   Highest education level: Not on file  Occupational History   Not on file  Tobacco Use   Smoking status: Every Day    Current packs/day: 1.00    Average packs/day: 1 pack/day for 40.0 years (40.0 ttl pk-yrs)    Types: Cigarettes    Passive exposure: Current   Smokeless tobacco: Never  Vaping Use   Vaping status: Never Used  Substance and Sexual Activity   Alcohol use: Yes    Comment: rarely wine   Drug use: No   Sexual activity: Not Currently    Birth control/protection: Post-menopausal  Other Topics Concern   Not on file  Social History Narrative   Not on file   Social Drivers of Health   Financial Resource Strain: Not on  file  Food Insecurity: Not on file  Transportation Needs: Not on file  Physical Activity: Not on file  Stress: Not on file  Social Connections: Not on file  Intimate Partner Violence: Not on file    Current Meds  Medication Sig   b complex vitamins capsule Take 1 capsule by mouth daily.   cholecalciferol (VITAMIN D) 400 UNITS TABS tablet Take 800 Units by mouth daily.   Coenzyme Q10 (COQ10) 100 MG CAPS Take by mouth daily.   Esomeprazole Magnesium (NEXIUM 24HR PO) Take by mouth.   fenofibrate  54 MG tablet Take 2 tablets (108 mg total) by mouth daily.   Resveratrol 250 MG CAPS Take 2 capsules by mouth daily. am      ROS:  Review of Systems  Constitutional:  Negative for fatigue, fever and unexpected weight change.  Respiratory:  Negative for cough, shortness of breath and wheezing.   Cardiovascular:  Negative for chest pain, palpitations and leg swelling.  Gastrointestinal:  Negative for blood in stool, constipation, diarrhea, nausea and vomiting.  Endocrine: Negative for cold intolerance, heat intolerance and polyuria.  Genitourinary:  Negative for dyspareunia, dysuria, flank pain, frequency, genital sores, hematuria, menstrual problem, pelvic pain, urgency, vaginal bleeding, vaginal discharge and vaginal pain.  Musculoskeletal:  Negative for back pain, joint swelling and myalgias.  Skin:  Negative for rash.  Neurological:  Negative for dizziness, syncope, light-headedness, numbness and headaches.  Hematological:  Negative for adenopathy.  Psychiatric/Behavioral:  Negative for agitation, confusion, sleep disturbance and suicidal ideas. The patient is not nervous/anxious.      Objective: BP 110/67   Pulse 69   Ht 5\' 6"  (1.676 m)   Wt 199 lb (90.3 kg)   BMI 32.12 kg/m    Physical Exam Constitutional:      Appearance: She is well-developed.  Genitourinary:     Vulva normal.     Right Labia: No rash, tenderness or lesions.    Left Labia: No tenderness, lesions or  rash.    No vaginal discharge, erythema or tenderness.     Moderate vaginal atrophy present.     Right Adnexa: not tender and no mass present.    Left Adnexa: not tender and no mass present.    No cervical motion tenderness, friability or polyp.     Uterus is not enlarged or tender.  Breasts:    Right: No mass, nipple discharge, skin change or tenderness.     Left: No mass, nipple discharge, skin change or tenderness.  Neck:     Thyroid: No  thyromegaly.  Cardiovascular:     Rate and Rhythm: Normal rate and regular rhythm.     Heart sounds: Normal heart sounds. No murmur heard. Pulmonary:     Effort: Pulmonary effort is normal.     Breath sounds: Normal breath sounds.  Abdominal:     Palpations: Abdomen is soft.     Tenderness: There is no abdominal tenderness. There is no guarding or rebound.  Musculoskeletal:        General: Normal range of motion.     Cervical back: Normal range of motion.  Lymphadenopathy:     Cervical: No cervical adenopathy.  Neurological:     General: No focal deficit present.     Mental Status: She is alert and oriented to person, place, and time.     Cranial Nerves: No cranial nerve deficit.  Skin:    General: Skin is warm and dry.  Psychiatric:        Mood and Affect: Mood normal.        Behavior: Behavior normal.        Thought Content: Thought content normal.        Judgment: Judgment normal.  Vitals reviewed.    Results for orders placed or performed in visit on 08/18/23 (from the past 24 hours)  POCT Urinalysis Dipstick     Status: Abnormal   Collection Time: 08/18/23  8:43 AM  Result Value Ref Range   Color, UA     Clarity, UA     Glucose, UA Negative Negative   Bilirubin, UA neg    Ketones, UA neg    Spec Grav, UA <=1.005 (A) 1.010 - 1.025   Blood, UA neg    pH, UA 7.5 5.0 - 8.0   Protein, UA Negative Negative   Urobilinogen, UA 0.2 0.2 or 1.0 E.U./dL   Nitrite, UA neg    Leukocytes, UA Negative Negative   Appearance     Odor       Assessment/Plan: Encounter for annual routine gynecological examination  Encounter for screening mammogram for malignant neoplasm of breast; pt current on mammo  Abnormal urine odor - Plan: POCT Urinalysis Dipstick; neg UA, increase water , may be related to supplements.  Blood tests for routine general physical examination - Plan: Comprehensive metabolic panel with GFR, CBC with Differential/Platelet, Hemoglobin A1c, Lipid panel  Mixed hyperlipidemia - Plan: Lipid panel; chck labs, will RF meds based on results.   Tobacco use disorder - Plan: CT CHEST LUNG CANCER SCREENING LOW DOSE WO CONTRAST; pt to call to schedule. Recommended tob cessation  Pre-diabetes - Plan: Hemoglobin A1c; check labs.   Atherosclerosis--pt aware of lung CT results. Cont diet changes/exercise/encouraged tob cessation or decreased amount. Pt can't tolerate statins but on lipid meds.           GYN counsel breast self exam, mammography screening, adequate intake of calcium and vitamin D, diet and exercise, tobacco cessation    F/U  Return in about 1 year (around 08/17/2024).  Alaine Loughney B. Ardella Chhim, PA-C 08/18/2023 9:48 AM

## 2023-08-18 ENCOUNTER — Ambulatory Visit (INDEPENDENT_AMBULATORY_CARE_PROVIDER_SITE_OTHER): Payer: PRIVATE HEALTH INSURANCE | Admitting: Obstetrics and Gynecology

## 2023-08-18 ENCOUNTER — Encounter: Payer: Self-pay | Admitting: Obstetrics and Gynecology

## 2023-08-18 VITALS — BP 110/67 | HR 69 | Ht 66.0 in | Wt 199.0 lb

## 2023-08-18 DIAGNOSIS — Z122 Encounter for screening for malignant neoplasm of respiratory organs: Secondary | ICD-10-CM

## 2023-08-18 DIAGNOSIS — Z01419 Encounter for gynecological examination (general) (routine) without abnormal findings: Secondary | ICD-10-CM | POA: Diagnosis not present

## 2023-08-18 DIAGNOSIS — R8290 Unspecified abnormal findings in urine: Secondary | ICD-10-CM

## 2023-08-18 DIAGNOSIS — Z0000 Encounter for general adult medical examination without abnormal findings: Secondary | ICD-10-CM

## 2023-08-18 DIAGNOSIS — R7303 Prediabetes: Secondary | ICD-10-CM

## 2023-08-18 DIAGNOSIS — I2721 Secondary pulmonary arterial hypertension: Secondary | ICD-10-CM

## 2023-08-18 DIAGNOSIS — Z1231 Encounter for screening mammogram for malignant neoplasm of breast: Secondary | ICD-10-CM

## 2023-08-18 DIAGNOSIS — E782 Mixed hyperlipidemia: Secondary | ICD-10-CM

## 2023-08-18 DIAGNOSIS — F17200 Nicotine dependence, unspecified, uncomplicated: Secondary | ICD-10-CM

## 2023-08-18 DIAGNOSIS — I7090 Unspecified atherosclerosis: Secondary | ICD-10-CM | POA: Insufficient documentation

## 2023-08-18 LAB — POCT URINALYSIS DIPSTICK
Bilirubin, UA: NEGATIVE
Blood, UA: NEGATIVE
Glucose, UA: NEGATIVE
Ketones, UA: NEGATIVE
Leukocytes, UA: NEGATIVE
Nitrite, UA: NEGATIVE
Protein, UA: NEGATIVE
Spec Grav, UA: <=1.005 — AB (ref 1.010–1.025)
Urobilinogen, UA: 0.2 U/dL
pH, UA: 7.5 (ref 5.0–8.0)

## 2023-08-18 NOTE — Patient Instructions (Signed)
 I value your feedback and you entrusting [place] with your care. If you get a [facility] patient survey, I would appreciate you taking the time to let us know about your experience today. Thank you! ? ? ?

## 2023-08-19 ENCOUNTER — Ambulatory Visit: Payer: Self-pay | Admitting: Obstetrics and Gynecology

## 2023-08-19 LAB — CBC WITH DIFFERENTIAL/PLATELET
Basophils Absolute: 0 10*3/uL (ref 0.0–0.2)
Basos: 1 %
EOS (ABSOLUTE): 0.1 10*3/uL (ref 0.0–0.4)
Eos: 2 %
Hematocrit: 38.2 % (ref 34.0–46.6)
Hemoglobin: 12.5 g/dL (ref 11.1–15.9)
Immature Grans (Abs): 0 10*3/uL (ref 0.0–0.1)
Immature Granulocytes: 0 %
Lymphocytes Absolute: 0.9 10*3/uL (ref 0.7–3.1)
Lymphs: 15 %
MCH: 30.6 pg (ref 26.6–33.0)
MCHC: 32.7 g/dL (ref 31.5–35.7)
MCV: 93 fL (ref 79–97)
Monocytes Absolute: 0.6 10*3/uL (ref 0.1–0.9)
Monocytes: 10 %
Neutrophils Absolute: 4.5 10*3/uL (ref 1.4–7.0)
Neutrophils: 72 %
Platelets: 317 10*3/uL (ref 150–450)
RBC: 4.09 x10E6/uL (ref 3.77–5.28)
RDW: 12.6 % (ref 11.7–15.4)
WBC: 6.2 10*3/uL (ref 3.4–10.8)

## 2023-08-19 LAB — COMPREHENSIVE METABOLIC PANEL WITH GFR
ALT: 9 IU/L (ref 0–32)
AST: 14 IU/L (ref 0–40)
Albumin: 4.2 g/dL (ref 3.9–4.9)
Alkaline Phosphatase: 67 IU/L (ref 44–121)
BUN/Creatinine Ratio: 22 (ref 12–28)
BUN: 15 mg/dL (ref 8–27)
Bilirubin Total: 0.2 mg/dL (ref 0.0–1.2)
CO2: 21 mmol/L (ref 20–29)
Calcium: 9.3 mg/dL (ref 8.7–10.3)
Chloride: 100 mmol/L (ref 96–106)
Creatinine, Ser: 0.67 mg/dL (ref 0.57–1.00)
Globulin, Total: 2.3 g/dL (ref 1.5–4.5)
Glucose: 88 mg/dL (ref 70–99)
Potassium: 4.3 mmol/L (ref 3.5–5.2)
Sodium: 136 mmol/L (ref 134–144)
Total Protein: 6.5 g/dL (ref 6.0–8.5)
eGFR: 99 mL/min/{1.73_m2} (ref >59)

## 2023-08-19 LAB — LIPID PANEL
Chol/HDL Ratio: 3.5 ratio (ref 0.0–4.4)
Cholesterol, Total: 209 mg/dL — ABNORMAL HIGH (ref 100–199)
HDL: 60 mg/dL (ref >39)
LDL Chol Calc (NIH): 138 mg/dL — ABNORMAL HIGH (ref 0–99)
Triglycerides: 63 mg/dL (ref 0–149)
VLDL Cholesterol Cal: 11 mg/dL (ref 5–40)

## 2023-08-19 LAB — HEMOGLOBIN A1C
Est. average glucose Bld gHb Est-mCnc: 120 mg/dL
Hgb A1c MFr Bld: 5.8 % — ABNORMAL HIGH (ref 4.8–5.6)

## 2023-08-19 MED ORDER — FENOFIBRATE 54 MG PO TABS
108.0000 mg | ORAL_TABLET | Freq: Every day | ORAL | 3 refills | Status: AC
Start: 2023-08-19 — End: ?

## 2023-08-19 NOTE — Telephone Encounter (Signed)
 Can you pls look into this for me and find out where the facility is and what is the cost? I can then send order. Thx.

## 2023-08-19 NOTE — Addendum Note (Signed)
 Addended by: Alyn Judge B on: 08/19/2023 08:12 AM   Modules accepted: Orders

## 2023-08-26 ENCOUNTER — Ambulatory Visit: Payer: PRIVATE HEALTH INSURANCE

## 2023-08-26 NOTE — Addendum Note (Signed)
 Addended by: Alyn Judge B on: 08/26/2023 08:20 AM   Modules accepted: Orders

## 2023-08-26 NOTE — Telephone Encounter (Signed)
 Pls get scheduled per note. Thx.

## 2023-08-29 ENCOUNTER — Ambulatory Visit
Admission: RE | Admit: 2023-08-29 | Discharge: 2023-08-29 | Disposition: A | Discharge status: Home / Self Care | Payer: PRIVATE HEALTH INSURANCE | Source: Ambulatory Visit | Attending: Obstetrics and Gynecology | Admitting: Obstetrics and Gynecology

## 2023-08-29 DIAGNOSIS — Z122 Encounter for screening for malignant neoplasm of respiratory organs: Secondary | ICD-10-CM | POA: Insufficient documentation

## 2023-09-07 NOTE — Telephone Encounter (Signed)
 Thank you. If it's not a covered service, then she will just have to pay out of pocket after they negotiate the rate. Thx for working on this with the pt.

## 2023-09-18 NOTE — Addendum Note (Signed)
 Addended by: Alyn Judge B on: 09/18/2023 11:50 AM   Modules accepted: Orders

## 2023-11-10 ENCOUNTER — Telehealth: Payer: Self-pay | Admitting: *Deleted

## 2023-11-10 NOTE — Telephone Encounter (Signed)
 Patient returning call. No previous cardiac history. Hasn't seen cardiologist. 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

## 2023-11-10 NOTE — Telephone Encounter (Signed)
 LMOVM to verify card hx.

## 2023-11-17 ENCOUNTER — Encounter: Payer: Self-pay | Admitting: Cardiology

## 2023-11-17 ENCOUNTER — Ambulatory Visit: Payer: PRIVATE HEALTH INSURANCE | Attending: Cardiology | Admitting: Cardiology

## 2023-11-17 VITALS — BP 120/62 | HR 67 | Ht 66.0 in | Wt 202.2 lb

## 2023-11-17 DIAGNOSIS — E7800 Pure hypercholesterolemia, unspecified: Secondary | ICD-10-CM

## 2023-11-17 DIAGNOSIS — I2510 Atherosclerotic heart disease of native coronary artery without angina pectoris: Secondary | ICD-10-CM | POA: Diagnosis not present

## 2023-11-17 DIAGNOSIS — F17200 Nicotine dependence, unspecified, uncomplicated: Secondary | ICD-10-CM

## 2023-11-17 DIAGNOSIS — I288 Other diseases of pulmonary vessels: Secondary | ICD-10-CM | POA: Diagnosis not present

## 2023-11-17 MED ORDER — EZETIMIBE 10 MG PO TABS: 10.0000 mg | ORAL_TABLET | Freq: Every day | ORAL | 3 refills | Status: DC

## 2023-11-17 NOTE — Progress Notes (Signed)
 Cardiology Office Note:    Date:  11/17/2023   ID:  [name], DOB 1960/06/18, MRN [id number]  PCP:  Patient, No Pcp Per   [facility] Providers Cardiologist:  None     Referring MD: Copland, Alicia B, PA-C   Chief Complaint  Patient presents with   Establish Care    New pt has been doing well with no complaints of chest pain, chest pressure or SOB, medciation reviewed verbally with patient   [name] is a 63 y.o. female who is being seen today for the evaluation of pulmonary artery enlargement at the request of Copland, Alicia B, PA-C.   History of Present Illness:    Eileen [facility] is a 63 y.o. female with a hx of CAD (LAD, LCx, RCA calcifications on chest CT ), hyperlipidemia, current smoker x 40+ years, COPD who presents due to coronary calcifications, main pulmonary artery enlargement.  Patient had a chest CT for lung cancer screening 07/2023 showing LAD, LCx, RCA calcifications.  Mild pulmonary artery enlargement also noted.  She is a current smoker, working on quitting.  Patient denies chest pain or shortness of breath.  Does not have a primary care physician, follows up with her GYN.  Has a history of muscle aches and joint pain with taking statins.  History of GERD with aspirin.  Father had a heart attack in his 7s, brother had coronary stents placed in his 5s.  Past Medical History:  Diagnosis Date   Abnormal Pap smear of cervix .   Anemia    Bacterial vaginosis    BRCA negative 07/2013   MyRisk neg   COPD (chronic obstructive pulmonary disease) (HCC)    mild, no meds   Dysmenorrhea    Endometriosis    Family history of breast cancer 07/2013   MyRisk neg; IBIS=15%   Family history of ovarian cancer    MyRisk neg 5/15   GERD (gastroesophageal reflux disease)    occasional heartburn   Hypercholesterolemia    Menorrhagia    Osteopenia    Ovarian cyst     Past Surgical History:  Procedure Laterality Date   BACK SURGERY      decompression,lower lumbar   COLONOSCOPY N/A 10/19/2014   Procedure: COLONOSCOPY;  Surgeon: [facility], MD;  Location: [facility];  Service: Gastroenterology;  Laterality: N/A;   DIAGNOSTIC LAPAROSCOPY     LAPAROSCOPIC ENDOMETRIOSIS FULGURATION      Current Medications: Current Meds  Medication Sig   b complex vitamins capsule Take 1 capsule by mouth daily.   cholecalciferol (VITAMIN D) 400 UNITS TABS tablet Take 800 Units by mouth daily.   Coenzyme Q10 (COQ10) 100 MG CAPS Take by mouth daily.   Esomeprazole Magnesium (NEXIUM 24HR PO) Take by mouth.   ezetimibe  (ZETIA ) 10 MG tablet Take 1 tablet (10 mg total) by mouth daily.   fenofibrate  54 MG tablet Take 2 tablets (108 mg total) by mouth daily.   Resveratrol 250 MG CAPS Take 2 capsules by mouth daily. am     Allergies:   Aspirin and Statins   Social History   Socioeconomic History   Marital status: Married    Spouse name: Not on file   Number of children: Not on file   Years of education: Not on file   Highest education level: Not on file  Occupational History   Not on file  Tobacco Use   Smoking status: Every Day    Current packs/day: 1.00  Average packs/day: 1 pack/day for 40.0 years (40.0 ttl pk-yrs)    Types: Cigarettes    Passive exposure: Current   Smokeless tobacco: Never  Vaping Use   Vaping status: Never Used  Substance and Sexual Activity   Alcohol use: Yes    Comment: rarely wine   Drug use: No   Sexual activity: Not Currently    Birth control/protection: Post-menopausal  Other Topics Concern   Not on file  Social History Narrative   Not on file   Social Drivers of Health   Financial Resource Strain: Not on file  Food Insecurity: Not on file  Transportation Needs: Not on file  Physical Activity: Not on file  Stress: Not on file  Social Connections: Not on file     Family History: The patient's family history includes Breast cancer (age of onset: 49) in her sister; Cancer in her  brother; Cervical cancer (age of onset: 9) in her sister; Coronary artery disease in her brother and father; Endometriosis in her sister; Heart disease in her brother, brother, brother, and father; Lung cancer (age of onset: 53) in her brother; Lymphoma (age of onset: 41) in her brother; Ovarian cancer in her maternal aunt.  ROS:   Please see the history of present illness.     All other systems reviewed and are negative.  EKGs/Labs/Other Studies Reviewed:    The following studies were reviewed today:  EKG Interpretation Date/Time:  Monday November 17 2023 10:23:22 EDT Ventricular Rate:  67 PR Interval:  144 QRS Duration:  76 QT Interval:  430 QTC Calculation: 454 R Axis:   23  Text Interpretation: Sinus rhythm with marked sinus arrhythmia Confirmed by Darliss Rogue ([id number]) on 11/17/2023 10:38:06 AM    Recent Labs: 08/18/2023: ALT 9; BUN 15; Creatinine, Ser 0.67; Hemoglobin 12.5; Platelets 317; Potassium 4.3; Sodium 136  Recent Lipid Panel    Component Value Date/Time   CHOL 209 (H) 08/18/2023 0836   TRIG 63 08/18/2023 0836   TRIG 60 08/30/2016 0809   HDL 60 08/18/2023 0836   HDL 71 08/30/2016 0809   CHOLHDL 3.5 08/18/2023 0836   LDLCALC 138 (H) 08/18/2023 0836     Risk Assessment/Calculations:             Physical Exam:    VS:  BP 120/62 (BP Location: Left Arm, Patient Position: Sitting, Cuff Size: Normal)   Pulse 67   Ht 5' 6 (1.676 m)   Wt 202 lb 3.2 oz (91.7 kg)   SpO2 97%   BMI 32.64 kg/m     Wt Readings from Last 3 Encounters:  11/17/23 202 lb 3.2 oz (91.7 kg)  08/18/23 199 lb (90.3 kg)  08/15/22 197 lb (89.4 kg)     GEN:  Well nourished, well developed in no acute distress HEENT: Normal NECK: No JVD; No carotid bruits CARDIAC: RRR, 1/6 systolic murmur RESPIRATORY:  Clear to auscultation without rales, wheezing or rhonchi  ABDOMEN: Soft, non-tender, non-distended MUSCULOSKELETAL:  No edema; No deformity  SKIN: Warm and dry NEUROLOGIC:  Alert  and oriented x 3 PSYCHIATRIC:  Normal affect   ASSESSMENT:    1. Coronary artery disease involving native heart, unspecified vessel or lesion type, unspecified whether angina present   2. Pure hypercholesterolemia   3. Current smoker   4. Enlarged pulmonary artery (HCC)    PLAN:    In order of problems listed above:  CAD, LAD, LCx, RCA calcifications on chest CT.  Denies chest pain.  Obtain echocardiogram.  Start Zetia  10 mg daily.  Patient not tolerant to aspirin.  Continue fenofibrate . Hyperlipidemia, start Zetia  10 mg daily, continue fenofibrate . Current smoker, smoking cessation advised. 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  Advised to establish care with PCP .  Treating underlying COPD will help if patient becomes symptomatic.  Patient clinically asymptomatic.  Follow-up after echo      Medication Adjustments/Labs and Tests Ordered: Current medicines are reviewed at length with the patient today.  Concerns regarding medicines are outlined above.  Orders Placed This Encounter  Procedures   EKG 12-Lead   ECHOCARDIOGRAM COMPLETE   Meds ordered this encounter  Medications   ezetimibe  (ZETIA ) 10 MG tablet    Sig: Take 1 tablet (10 mg total) by mouth daily.    Dispense:  90 tablet    Refill:  3    Patient Instructions  Medication Instructions:  - START zetia  10 mg daily   *If you need a refill on your cardiac medications before your next appointment, please call your pharmacy*  Lab Work: No labs ordered today  If you have labs (blood work) drawn today and your tests are completely normal, you will receive your results only by: MyChart Message (if you have MyChart) OR A paper copy in the mail If you have any lab test that is abnormal or we need to change your treatment, we will call you to review the results.  Testing/Procedures: Your physician has requested that  you have an echocardiogram. Echocardiography is a painless test that uses sound waves to create images of your heart. It provides your doctor with information about the size and shape of your heart and how well your heart's chambers and valves are working.   You may receive an ultrasound enhancing agent through an IV if needed to better visualize your heart during the echo. This procedure takes approximately one hour.  There are no restrictions for this procedure.  This will take place at [address] [facility] [address] [facility]) [address], [place] [address]  Please note: We ask at that you not bring children with you during ultrasound (echo/ vascular) testing. Due to room size and safety concerns, children are not allowed in the ultrasound rooms during exams. Our front office staff cannot provide observation of children in our lobby area while testing is being conducted. An adult accompanying a patient to their appointment will only be allowed in the ultrasound room at the discretion of the ultrasound technician under special circumstances. We apologize for any inconvenience.   Follow-Up: At [facility], you and your health needs are our priority.  As part of our continuing mission to provide you with exceptional heart care, our providers are all part of one team.  This team includes your primary Cardiologist (physician) and Advanced Practice Providers or APPs (Physician Assistants and Nurse Practitioners) who all work together to provide you with the care you need, when you need it.  Your next appointment:   3 month(s)  Provider:   You may see Dr. Darliss or one of the following Advanced Practice Providers on your designated Care Team:   Lonni Meager, NP Lesley Maffucci, PA-C Bernardino Bring, PA-C Cadence Wolbach, PA-C Tylene Lunch, NP Barnie Hila, NP    We recommend signing up for the patient portal called MyChart.  Sign up information is provided on this After Visit  Summary.  MyChart is used to connect with patients for Virtual  Visits (Telemedicine).  Patients are able to view lab/test results, encounter notes, upcoming appointments, etc.  Non-urgent messages can be sent to your provider as well.   To learn more about what you can do with MyChart, go to [email or web address].         Signed, Redell Cave, MD  11/17/2023 11:19 AM    [facility]

## 2023-11-17 NOTE — Patient Instructions (Signed)
 Medication Instructions:  - START zetia  10 mg daily   *If you need a refill on your cardiac medications before your next appointment, please call your pharmacy*  Lab Work: No labs ordered today  If you have labs (blood work) drawn today and your tests are completely normal, you will receive your results only by: MyChart Message (if you have MyChart) OR A paper copy in the mail If you have any lab test that is abnormal or we need to change your treatment, we will call you to review the results.  Testing/Procedures: Your physician has requested that you have an echocardiogram. Echocardiography is a painless test that uses sound waves to create images of your heart. It provides your doctor with information about the size and shape of your heart and how well your heart's chambers and valves are working.   You may receive an ultrasound enhancing agent through an IV if needed to better visualize your heart during the echo. This procedure takes approximately one hour.  There are no restrictions for this procedure.  This will take place at [address] [facility] [address], [place] [address]  Please note: We ask at that you not bring children with you during ultrasound (echo/ vascular) testing. Due to room size and safety concerns, children are not allowed in the ultrasound rooms during exams. Our front office staff cannot provide observation of children in our lobby area while testing is being conducted. An adult accompanying a patient to their appointment will only be allowed in the ultrasound room at the discretion of the ultrasound technician under special circumstances. We apologize for any inconvenience.   Follow-Up: At [facility], you and your health needs are our priority.  As part of our continuing mission to provide you with exceptional heart care, our providers are all part of one team.  This team includes your primary Cardiologist (physician) and Advanced Practice  Providers or APPs (Physician Assistants and Nurse Practitioners) who all work together to provide you with the care you need, when you need it.  Your next appointment:   3 month(s)  Provider:   You may see Dr. Darliss or one of the following Advanced Practice Providers on your designated Care Team:   Lonni Meager, NP Lesley Maffucci, PA-C Bernardino Bring, PA-C Cadence Norton, PA-C Tylene Lunch, NP Barnie Hila, NP    We recommend signing up for the patient portal called MyChart.  Sign up information is provided on this After Visit Summary.  MyChart is used to connect with patients for Virtual Visits (Telemedicine).  Patients are able to view lab/test results, encounter notes, upcoming appointments, etc.  Non-urgent messages can be sent to your provider as well.   To learn more about what you can do with MyChart, go to [email or web address].

## 2023-12-24 ENCOUNTER — Other Ambulatory Visit: Payer: PRIVATE HEALTH INSURANCE

## 2023-12-29 ENCOUNTER — Ambulatory Visit: Payer: PRIVATE HEALTH INSURANCE | Admitting: Student

## 2023-12-29 ENCOUNTER — Encounter: Payer: Self-pay | Admitting: Student

## 2023-12-29 VITALS — BP 110/74 | HR 74 | Ht 66.0 in | Wt 201.0 lb

## 2023-12-29 DIAGNOSIS — K219 Gastro-esophageal reflux disease without esophagitis: Secondary | ICD-10-CM | POA: Insufficient documentation

## 2023-12-29 DIAGNOSIS — I2510 Atherosclerotic heart disease of native coronary artery without angina pectoris: Secondary | ICD-10-CM | POA: Insufficient documentation

## 2023-12-29 DIAGNOSIS — F17200 Nicotine dependence, unspecified, uncomplicated: Secondary | ICD-10-CM | POA: Insufficient documentation

## 2023-12-29 DIAGNOSIS — R7303 Prediabetes: Secondary | ICD-10-CM | POA: Insufficient documentation

## 2023-12-29 DIAGNOSIS — Z23 Encounter for immunization: Secondary | ICD-10-CM | POA: Diagnosis not present

## 2023-12-29 DIAGNOSIS — J449 Chronic obstructive pulmonary disease, unspecified: Secondary | ICD-10-CM | POA: Insufficient documentation

## 2023-12-29 DIAGNOSIS — J439 Emphysema, unspecified: Secondary | ICD-10-CM

## 2023-12-29 DIAGNOSIS — I7090 Unspecified atherosclerosis: Secondary | ICD-10-CM

## 2023-12-29 DIAGNOSIS — T466X5A Adverse effect of antihyperlipidemic and antiarteriosclerotic drugs, initial encounter: Secondary | ICD-10-CM | POA: Insufficient documentation

## 2023-12-29 DIAGNOSIS — I288 Other diseases of pulmonary vessels: Secondary | ICD-10-CM | POA: Insufficient documentation

## 2023-12-29 DIAGNOSIS — G720 Drug-induced myopathy: Secondary | ICD-10-CM

## 2023-12-29 NOTE — Assessment & Plan Note (Signed)
 She is on fenofibrate 

## 2023-12-29 NOTE — Assessment & Plan Note (Addendum)
 Recently started on Zetia  10 mg daily, no side effects with this.

## 2023-12-29 NOTE — Assessment & Plan Note (Signed)
 Noted on recent low dose CT in May. Continue to follow up with cardiology.

## 2023-12-29 NOTE — Assessment & Plan Note (Addendum)
 Is working on quiting, started in 08/2023 gradually cutting down from 1ppd. Reducing cigarette use by 1 cigarette a week, now smoking 5 cigarettes a day she is using 7 mg nicotine patch. Has had nausea with nicotine patch and gum in the past but is currently tolerating well.  -quite date set for about 5 weeks from now -Continue nicotine 7 mg patch for 2 weeks after quitting and then stop patches

## 2023-12-29 NOTE — Assessment & Plan Note (Addendum)
 Dx with COPD around age 63 and was seeing a pulmonology for about 5 years and previously on inhalers. Stopped as she felt they were not helping. Does not have significant symptoms of cough, dyspnea, or wheezing regularly. Has some mild dyspnea when walking up a hill. Feels symptoms much improved since she decreased smoking. Discussed inhalers today but she declines. Will consider getting PFTs in the future -alpha anti trypsin sent today -continue to work on smoking cessation

## 2023-12-29 NOTE — Assessment & Plan Note (Signed)
 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 Started Zetia  around 10/2023. Will repeat lipid panel in 3- 6 months.

## 2023-12-29 NOTE — Assessment & Plan Note (Addendum)
 Previously had statin intolerance with muslce and joint pain on multiple agents

## 2023-12-29 NOTE — Assessment & Plan Note (Addendum)
 Last A1c 5.8% in May, asymptomatic. Continue diet and exercise. Repeat in 12 months.

## 2023-12-29 NOTE — Progress Notes (Signed)
 New Patient Office Visit  Subjective    Patient ID: Eileen Cunningham, female    DOB: May 18, 1960  Age: 63 y.o. MRN: [id number]  CC:  Chief Complaint  Patient presents with   Establish Care    Patient is here to establish care     HPI Eileen Cunningham is a 63 year old person living with COPD, CAD, HLD, GERD, tobacco use disorder, and enlarged pulmonary artery  who presents to establish care. No acute complaints today. Please refer to problem based charting for further details and assessment and plan of current problem and chronic medical conditions.  Outpatient Encounter Medications as of 12/29/2023  Medication Sig   Ashwagandha 500 MG CAPS Take 1,000 mg by mouth daily.   aspirin EC 81 MG tablet Take 81 mg by mouth daily.   b complex vitamins capsule Take 1 capsule by mouth daily.   cholecalciferol (VITAMIN D) 400 UNITS TABS tablet Take 800 Units by mouth daily.   Coenzyme Q10 (COQ10) 100 MG CAPS Take by mouth daily.   Esomeprazole Magnesium (NEXIUM 24HR PO) Take by mouth.   ezetimibe  (ZETIA ) 10 MG tablet Take 1 tablet (10 mg total) by mouth daily.   fenofibrate  54 MG tablet Take 2 tablets (108 mg total) by mouth daily.   Resveratrol 250 MG CAPS Take 2 capsules by mouth daily. am   No facility-administered encounter medications on file as of 12/29/2023.    Past Medical History:  Diagnosis Date   Abnormal Pap smear of cervix .   Anemia    Bacterial vaginosis    BRCA negative 07/2013   MyRisk neg   COPD (chronic obstructive pulmonary disease) (HCC)    mild, no meds   Dysmenorrhea    Endometriosis    Family history of breast cancer 07/2013   MyRisk neg; IBIS=15%   Family history of ovarian cancer    MyRisk neg 5/15   GERD (gastroesophageal reflux disease)    occasional heartburn   Hypercholesterolemia    Menorrhagia    Osteopenia    Ovarian cyst     Past Surgical History:  Procedure Laterality Date   BACK SURGERY     decompression,lower lumbar   COLONOSCOPY N/A  10/19/2014   Procedure: COLONOSCOPY;  Surgeon: Deward CINDERELLA Piedmont, MD;  [facility]: [facility];  Service: Gastroenterology;  Laterality: N/A;   DIAGNOSTIC LAPAROSCOPY     LAPAROSCOPIC ENDOMETRIOSIS FULGURATION      Family History  Problem Relation Age of Onset   Diabetes Mother    Heart disease Father    Coronary artery disease Father    Breast cancer Sister 82       recurrance at age 45   Endometriosis Sister    Cervical cancer Sister 69   Heart disease Brother    Lymphoma Brother 59       non hodgkins   Coronary artery disease Brother    Coronary artery disease Brother    Heart disease Brother    Lung cancer Brother 60   Heart disease Brother    Cancer Brother        gallbladder   Ovarian cancer Maternal Aunt     Social History   Socioeconomic History   Marital status: Married    Spouse name: Not on file   Number of children: Not on file   Years of education: Not on file   Highest education level: Associate degree: academic program  Occupational History   Not on file  Tobacco Use  Smoking status: Every Day    Current packs/day: 1.00    Average packs/day: 1 pack/day for 40.0 years (40.0 ttl pk-yrs)    Types: Cigarettes    Passive exposure: Current   Smokeless tobacco: Never  Vaping Use   Vaping status: Never Used  Substance and Sexual Activity   Alcohol use: Yes    Comment: rarely wine   Drug use: No   Sexual activity: Not Currently    Birth control/protection: Post-menopausal  Other Topics Concern   Not on file  Social History Narrative   Not on file   Social Drivers of Health   Financial Resource Strain: Low Risk  (12/28/2023)   Overall Financial Resource Strain (CARDIA)    Difficulty of Paying Living Expenses: Not very hard  Food Insecurity: No Food Insecurity (12/28/2023)   Hunger Vital Sign    Worried About Running Out of Food in the Last Year: Never true    Ran Out of Food in the Last Year: Never true  Transportation Needs: No Transportation Needs  (12/28/2023)   PRAPARE - Administrator, Civil Service (Medical): No    Lack of Transportation (Non-Medical): No  Physical Activity: Sufficiently Active (12/28/2023)   Exercise Vital Sign    Days of Exercise per Week: 6 days    Minutes of Exercise per Session: 30 min  Stress: No Stress Concern Present (12/28/2023)   [employer] of Occupational Health - Occupational Stress Questionnaire    Feeling of Stress: Only a little  Social Connections: Moderately Integrated (12/28/2023)   Social Connection and Isolation Panel    Frequency of Communication with Friends and Family: More than three times a week    Frequency of Social Gatherings with Friends and Family: More than three times a week    Attends Religious Services: 1 to 4 times per year    Active Member of [employer] or Organizations: No    Attends Banker Meetings: Not on file    Marital Status: Married  Catering manager Violence: Not At Risk (12/29/2023)   Humiliation, Afraid, Rape, and Kick questionnaire    Fear of Current or Ex-Partner: No    Emotionally Abused: No    Physically Abused: No    Sexually Abused: No    ROS Refer to HPI    Objective   BP 110/74   Pulse 74   Ht 5' 6 (1.676 m)   Wt 201 lb (91.2 kg)   SpO2 97%   BMI 32.44 kg/m   Physical Exam Constitutional:      Appearance: Normal appearance.  HENT:     Head: Normocephalic and atraumatic.     Mouth/Throat:     Mouth: Mucous membranes are moist.     Pharynx: Oropharynx is clear.  Eyes:     Extraocular Movements: Extraocular movements intact.     Conjunctiva/sclera: Conjunctivae normal.     Pupils: Pupils are equal, round, and reactive to light.  Cardiovascular:     Rate and Rhythm: Normal rate and regular rhythm.  Pulmonary:     Effort: Pulmonary effort is normal.     Breath sounds: No rhonchi or rales.  Abdominal:     General: Abdomen is flat. Bowel sounds are normal. There is no distension.     Palpations: Abdomen is soft.      Tenderness: There is no abdominal tenderness.  Musculoskeletal:        General: Normal range of motion.     Right lower leg: No edema.  Left lower leg: No edema.  Skin:    General: Skin is warm and dry.     Capillary Refill: Capillary refill takes less than 2 seconds.  Neurological:     General: No focal deficit present.     Mental Status: She is alert and oriented to person, place, and time.  Psychiatric:        Mood and Affect: Mood normal.        Behavior: Behavior normal.        12/29/2023    2:10 PM  Depression screen PHQ 2/9  Decreased Interest 0  Down, Depressed, Hopeless 0  PHQ - 2 Score 0  Altered sleeping 0  Tired, decreased energy 0  Change in appetite 0  Feeling bad or failure about yourself  0  Trouble concentrating 0  Moving slowly or fidgety/restless 0  Suicidal thoughts 0  PHQ-9 Score 0  Difficult doing work/chores Not difficult at all      12/29/2023    2:10 PM  GAD 7 : Generalized Anxiety Score  Nervous, Anxious, on Edge 0  Control/stop worrying 0  Worry too much - different things 0  Trouble relaxing 0  Restless 0  Easily annoyed or irritable 0  Afraid - awful might happen 0  Total GAD 7 Score 0  Anxiety Difficulty Not difficult at all    Last CBC Lab Results  Component Value Date   WBC 6.2 08/18/2023   HGB 12.5 08/18/2023   HCT 38.2 08/18/2023   MCV 93 08/18/2023   MCH 30.6 08/18/2023   RDW 12.6 08/18/2023   PLT 317 08/18/2023   Last metabolic panel Lab Results  Component Value Date   GLUCOSE 88 08/18/2023   NA 136 08/18/2023   K 4.3 08/18/2023   CL 100 08/18/2023   CO2 21 08/18/2023   BUN 15 08/18/2023   CREATININE 0.67 08/18/2023   EGFR 99 08/18/2023   CALCIUM 9.3 08/18/2023   PROT 6.5 08/18/2023   ALBUMIN 4.2 08/18/2023   LABGLOB 2.3 08/18/2023   AGRATIO 2.0 08/02/2022   BILITOT 0.2 08/18/2023   ALKPHOS 67 08/18/2023   AST 14 08/18/2023   ALT 9 08/18/2023   Last lipids Lab Results  Component Value Date    CHOL 209 (H) 08/18/2023   HDL 60 08/18/2023   LDLCALC 138 (H) 08/18/2023   TRIG 63 08/18/2023   CHOLHDL 3.5 08/18/2023   Last hemoglobin A1c Lab Results  Component Value Date   HGBA1C 5.8 (H) 08/18/2023        Assessment & Plan:  Tobacco use disorder Assessment & Plan: Is working on quiting, started in 08/2023 gradually cutting down from 1ppd. Reducing cigarette use by 1 cigarette a week, now smoking 5 cigarettes a day she is using 7 mg nicotine patch. Has had nausea with nicotine patch and gum in the past but is currently tolerating well.  -quite date set for about 5 weeks from now -Continue nicotine 7 mg patch for 2 weeks after quitting and then stop patches   Prediabetes Assessment & Plan: Last A1c 5.8% in May, asymptomatic. Continue diet and exercise. Repeat in 12 months.    Encounter for immunization -     Flu vaccine trivalent PF, 6mos and older(Flulaval,Afluria,Fluarix,Fluzone) -     Pneumococcal conjugate vaccine 20-valent  Atherosclerosis Assessment & Plan: Recently started on Zetia  10 mg daily, no side effects with this.    Statin myopathy Assessment & Plan: Previously had statin intolerance with muslce and joint pain on  multiple agents    Gastroesophageal reflux disease, unspecified whether esophagitis present Assessment & Plan: Does eat spicy food frequently is on nexium daily and cimetidine as needed. Discuss avoiding triggering foods. Will continue to monitor symptoms. Continue current medications.    Pulmonary emphysema, unspecified emphysema type Assessment & Plan: Dx with COPD around age 61 and was seeing a pulmonology for about 5 years and previously on inhalers. Stopped as she felt they were not helping. Does not have significant symptoms of cough, dyspnea, or wheezing regularly. Has some mild dyspnea when walking up a hill. Feels symptoms much improved since she decreased smoking. Discussed inhalers today but she declines. Will consider getting PFTs  in the future -alpha anti trypsin sent today -continue to work on smoking cessation   Coronary artery disease involving native coronary artery of native heart without angina pectoris Assessment & Plan: She is on fenofibrate , statin myopathy with multiple agents in the past, now on fenofibrate  and recently started on zetia  which she is tolerating well. Started Zetia  around 10/2023. Will repeat lipid panel in 3- 6 months.    Enlarged pulmonary artery [facility] Assessment & Plan: Noted on recent low dose CT in May. Continue to follow up with cardiology.     Return in about 6 months (around 06/27/2024) for physical.   Harlene Saddler, MD

## 2023-12-29 NOTE — Assessment & Plan Note (Addendum)
 Does eat spicy food frequently is on nexium daily and cimetidine as needed. Discuss avoiding triggering foods. Will continue to monitor symptoms. Continue current medications.

## 2024-01-20 ENCOUNTER — Ambulatory Visit (HOSPITAL_COMMUNITY)
Admission: RE | Admit: 2024-01-20 | Discharge: 2024-01-20 | Disposition: A | Discharge status: Home / Self Care | Payer: PRIVATE HEALTH INSURANCE | Source: Ambulatory Visit | Attending: Cardiology | Admitting: Cardiology

## 2024-01-20 DIAGNOSIS — I2510 Atherosclerotic heart disease of native coronary artery without angina pectoris: Secondary | ICD-10-CM | POA: Insufficient documentation

## 2024-01-20 LAB — ECHOCARDIOGRAM COMPLETE
Area-P 1/2: 2.87 cm2
S' Lateral: 2.8 cm

## 2024-01-22 ENCOUNTER — Ambulatory Visit: Payer: Self-pay | Admitting: Cardiology

## 2024-02-19 ENCOUNTER — Ambulatory Visit: Payer: PRIVATE HEALTH INSURANCE | Attending: Cardiology | Admitting: Cardiology

## 2024-02-19 ENCOUNTER — Ambulatory Visit: Payer: PRIVATE HEALTH INSURANCE

## 2024-02-19 ENCOUNTER — Encounter: Payer: Self-pay | Admitting: Cardiology

## 2024-02-19 VITALS — BP 118/60 | HR 60 | Ht 66.0 in | Wt 203.6 lb

## 2024-02-19 DIAGNOSIS — R000 Tachycardia, unspecified: Secondary | ICD-10-CM | POA: Diagnosis not present

## 2024-02-19 DIAGNOSIS — F17200 Nicotine dependence, unspecified, uncomplicated: Secondary | ICD-10-CM | POA: Diagnosis not present

## 2024-02-19 DIAGNOSIS — R002 Palpitations: Secondary | ICD-10-CM

## 2024-02-19 DIAGNOSIS — E7800 Pure hypercholesterolemia, unspecified: Secondary | ICD-10-CM

## 2024-02-19 DIAGNOSIS — I2510 Atherosclerotic heart disease of native coronary artery without angina pectoris: Secondary | ICD-10-CM

## 2024-02-19 DIAGNOSIS — I288 Other diseases of pulmonary vessels: Secondary | ICD-10-CM

## 2024-02-19 DIAGNOSIS — I479 Paroxysmal tachycardia, unspecified: Secondary | ICD-10-CM

## 2024-02-19 MED ORDER — EZETIMIBE 10 MG PO TABS: 10.0000 mg | ORAL_TABLET | Freq: Every day | ORAL | 3 refills | Status: AC

## 2024-02-19 NOTE — Patient Instructions (Signed)
 Medication Instructions:  Your physician recommends that you continue on your current medications as directed. Please refer to the Current Medication list given to you today.   *If you need a refill on your cardiac medications before your next appointment, please call your pharmacy*  Lab Work: No labs ordered today  If you have labs (blood work) drawn today and your tests are completely normal, you will receive your results only by: MyChart Message (if you have MyChart) OR A paper copy in the mail If you have any lab test that is abnormal or we need to change your treatment, we will call you to review the results.  Testing/Procedures: Eileen Cunningham- Long Term Monitor Instructions  Your physician has requested you wear a ZIO patch monitor for 14 days.  This is a single patch monitor. [employer] supplies one patch monitor per enrollment. Additional stickers are not available. Please do not apply patch if you will be having a Nuclear Stress Test,  Echocardiogram, Cardiac CT, MRI, or Chest Xray during the period you would be wearing the  monitor. The patch cannot be worn during these tests. You cannot remove and re-apply the  ZIO XT patch monitor.  Your ZIO patch monitor will be mailed 3 day USPS to your address on file. It may take 3-5 days  to receive your monitor after you have been enrolled.  Once you have received your monitor, please review the enclosed instructions. Your monitor  has already been registered assigning a specific monitor serial # to you.  Billing and Patient Assistance Program Information  We have supplied [employer] with any of your insurance information on file for billing purposes. [employer] offers a sliding scale Patient Assistance Program for patients that do not have  insurance, or whose insurance does not completely cover the cost of the ZIO monitor.  You must apply for the Patient Assistance Program to qualify for this discounted rate.  To apply, please call [employer] at  [phone], select option 4, select option 2, ask to apply for  Patient Assistance Program. Eileen Cunningham will ask your household income, and how many people  are in your household. They will quote your out-of-pocket cost based on that information.  [employer] will also be able to set up a 10-month, interest-free payment plan if needed.  Applying the monitor   Shave hair from upper left chest.  Hold abrader disc by orange tab. Rub abrader in 40 strokes over the upper left chest as  indicated in your monitor instructions.  Clean area with 4 enclosed alcohol pads. Let dry.  Apply patch as indicated in monitor instructions. Patch will be placed under collarbone on left  side of chest with arrow pointing upward.  Rub patch adhesive wings for 2 minutes. Remove white label marked 1. Remove the white  label marked 2. Rub patch adhesive wings for 2 additional minutes.  While looking in a mirror, press and release button in center of patch. A small green light will  flash 3-4 times. This will be your only indicator that the monitor has been turned on.  Do not shower for the first 24 hours. You may shower after the first 24 hours.  Press the button if you feel a symptom. You will hear a small click. Record Date, Time and  Symptom in the Patient Logbook.  When you are ready to remove the patch, follow instructions on the last 2 pages of Patient  Logbook. Stick patch monitor onto the last page of Patient Logbook.  Place  Patient Logbook in the blue and white box. Use locking tab on box and tape box closed  securely. The blue and white box has prepaid postage on it. Please place it in the mailbox as  soon as possible. Your physician should have your test results approximately 7 days after the  monitor has been mailed back to [facility].  Call [facility] Customer Care at [phone] if you have questions regarding  your ZIO XT patch monitor. Call them immediately if you see an orange light  blinking on your  monitor.  If your monitor falls off in less than 4 days, contact our Monitor department at [phone].  If your monitor becomes loose or falls off after 4 days call [employer] at [phone] for  suggestions on securing your monitor   Follow-Up: At [facility], you and your health needs are our priority.  As part of our continuing mission to provide you with exceptional heart care, our providers are all part of one team.  This team includes your primary Cardiologist (physician) and Advanced Practice Providers or APPs (Physician Assistants and Nurse Practitioners) who all work together to provide you with the care you need, when you need it.  Your next appointment:   6 month(s)  Provider:   You may see Eileen Cave, MD or one of the following Advanced Practice Providers on your designated Care Team:   Eileen Lunch, NP

## 2024-02-19 NOTE — Progress Notes (Signed)
 Cardiology Office Note   Date:  02/19/2024  ID:  Shayle, Donahoo Aug 04, 1960, MRN [id number] PCP: Lemon Raisin, MD  [facility] Providers Cardiologist:  Redell Cave, MD Cardiology APP:  Gerard Frederick, NP     History of Present Illness Eileen Cunningham is a 63 y.o. female with a past medical history of coronary artery disease (LAD, left circumflex, RCA calcifications on chest CT), hyperlipidemia, current smoker x 40+ years, COPD, anemia, who presents today for follow-up.   Prior chest CT for lung cancer screening in 07/2023 showed LAD, left circumflex, RCA calcifications.  Mild pulmonary artery enlargement was also noted.  She is a current smoker working on quitting.  Patient denies any chest pain or shortness of breath.  She also noted a history of GERD with aspirin.  She was last seen in clinic 11/17/2023 by Dr.Agbor-Etang to establish care.  She noted that her father had a heart attack in his 46s and a brother had coronary stents placed in his 51s.  She continued to remain chest pain-free.  She was scheduled for an echocardiogram, recommended to start ezetimibe  10 mg daily, and continue with fenofibrate .   She returns to clinic today stating that she has been doing well from a cardiac perspective.  She denies any chest pain.  She occasionally has shortness of breath on hills and inclines.  She also has occasional palpitations.  She has noted as of late that her Apple Watch registered her heart rate has been as high as 150/min with an average of 125-135 with a quick recovery.  She is unsure of how accurate the ab is.  She continues to try to remain active walking several miles daily.  She has been compliant with her current medication regimen without any undue side effects.  She continues to work on smoking cessation.  Denies any recent hospitalizations or visits to the emergency department.  ROS: 10 point review of systems has been reviewed and considered negative with  exception was listed in HPI  Studies Reviewed EKG Interpretation Date/Time:  Thursday February 19 2024 08:51:58 EST Ventricular Rate:  60 PR Interval:  158 QRS Duration:  66 QT Interval:  438 QTC Calculation: 438 R Axis:   25  Text Interpretation: Sinus rhythm with Premature atrial complexes When compared with ECG of 17-Nov-2023 10:23, Confirmed by Gerard Frederick ([id number]) on 02/19/2024 8:57:35 AM    2d echo 01/20/2024  1. Left ventricular ejection fraction, by estimation, is 60 to 65%. Left  ventricular ejection fraction by 3D volume is 63 %. The left ventricle has  normal function. The left ventricle has no regional wall motion  abnormalities. Left ventricular diastolic   parameters are consistent with Grade I diastolic dysfunction (impaired  relaxation). The average left ventricular global longitudinal strain is  -21.0 %. The global longitudinal strain is normal.   2. Right ventricular systolic function is normal. The right ventricular  size is normal. There is normal pulmonary artery systolic pressure. The  estimated right ventricular systolic pressure is 19.3 mmHg.   3. Left atrial size was moderately dilated.   4. The mitral valve is grossly normal. Trivial mitral valve  regurgitation.   5. The aortic valve is tricuspid. Aortic valve regurgitation is not  visualized. Aortic valve sclerosis is present, with no evidence of aortic  valve stenosis.   6. The inferior vena cava is normal in size with greater than 50%  respiratory variability, suggesting right atrial pressure of 3 mmHg.   Risk Assessment/Calculations  The 10-year ASCVD risk score (Arnett DK, et al., 2019) is: 6.8%   Values used to calculate the score:     Age: 55 years     Clincally relevant sex: Female     Is Non-Hispanic African American: No     Diabetic: No     Tobacco smoker: Yes     Systolic Blood Pressure: 118 mmHg     Is BP treated: No     HDL Cholesterol: 60 mg/dL     Total Cholesterol: 209 mg/dL           Physical Exam VS:  BP 118/60 (BP Location: Left Arm, Patient Position: Sitting, Cuff Size: Normal)   Pulse 60 Comment: 67 oximeter  Ht 5' 6 (1.676 m)   Wt 203 lb 9.6 oz (92.4 kg)   SpO2 99%   BMI 32.86 kg/m        Wt Readings from Last 3 Encounters:  02/19/24 203 lb 9.6 oz (92.4 kg)  12/29/23 201 lb (91.2 kg)  11/17/23 202 lb 3.2 oz (91.7 kg)    GEN: Well nourished, well developed in no acute distress NECK: No JVD; No carotid bruits CARDIAC: RRR, I/VI systolic murmur RUSB, without rubs or gallops RESPIRATORY:  Clear to auscultation without rales, wheezing or rhonchi  ABDOMEN: Soft, non-tender, non-distended EXTREMITIES:  No edema; No deformity   ASSESSMENT AND PLAN Coronary artery disease involving native arteries of the native heart without angina with previous LAD, left circumflex, RCA calcifications noted on chest CT. she continues to deny angina or anginal equivalents.  Echocardiogram completed that reveals no wall motion abnormalities.  She has continued on ezetimibe  and fenofibrate .  She has tolerated ezetimibe  well.  She is intolerant to aspirin.  EKG today reveals sinus rhythm with occasional PACs with a rate of 60.  No acute ischemic changes and no further ischemic evaluation needed at this time.  Hyperlipidemia ldl 138.  She is continued on ezetimibe  and fenofibrate .  She has upcoming labs with her primary care provider to reevaluate the effectiveness of the ezetimibe .  She is also given information today on Nexletol  as well as has Repatha and Praluent.  Depending on the results of her labs from her PCP will determine the escalation needed on cholesterol management.  Her current ASCVD risk is 6.8% in the next 10 years.  The recommendation is for high intensity statin therapy.  Unfortunately she is intolerant to multiple statins.  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  Current smoker with total cessation continue to be recommended and she has slowly been decreasing the amount that she smokes daily.  Palpitations and tachycardia noted with ambulation.  Her Apple watch reveals heart rates up to the rate of 153 bpm with average 135 during activity.  With PACs noted on EKG today she has been placed on a ZIO XT monitor for 14 days to rule out any arrhythmia.       Dispo: Patient to return to clinic to see MD/APP in 6 months or sooner if needed, if a ZIO XT monitoring comes back with abnormal results or return appointment will have to be moved up to discuss findings.  Signed, Shiva Sahagian, NP

## 2024-03-22 ENCOUNTER — Ambulatory Visit: Payer: Self-pay | Admitting: Cardiology

## 2024-03-22 DIAGNOSIS — I479 Paroxysmal tachycardia, unspecified: Secondary | ICD-10-CM

## 2024-03-22 NOTE — Progress Notes (Signed)
 Average heart rate of 72 bpm, 46 episodes of tachycardia with the longest lasting 19.6 seconds, no atrial fibrillation or atrial flutter, occasional PACs with a 1.7% burden, and no sustained arrhythmia.  Recommend starting Toprol -XL 25 mg daily.  Move follow-up appointment to 6 weeks for reevaluation to determine how she is tolerating additional medication.

## 2024-03-23 MED ORDER — METOPROLOL SUCCINATE ER 25 MG PO TB24: 25.0000 mg | ORAL_TABLET | Freq: Every day | ORAL | 3 refills | Status: AC

## 2024-05-18 ENCOUNTER — Ambulatory Visit: Payer: PRIVATE HEALTH INSURANCE | Admitting: Cardiology

## 2024-07-05 ENCOUNTER — Encounter: Payer: PRIVATE HEALTH INSURANCE | Admitting: Student
